# Patient Record
Sex: Female | Born: 1950 | Race: White | Hispanic: No | Marital: Married | Smoking: Never smoker
Health system: Southern US, Community
[De-identification: ages and names within clinical notes are randomized; demographics above are authoritative.]

## PROBLEM LIST (undated history)

## (undated) DIAGNOSIS — M1990 Unspecified osteoarthritis, unspecified site: Secondary | ICD-10-CM

## (undated) DIAGNOSIS — F32A Depression, unspecified: Secondary | ICD-10-CM

## (undated) DIAGNOSIS — F329 Major depressive disorder, single episode, unspecified: Secondary | ICD-10-CM

## (undated) DIAGNOSIS — F419 Anxiety disorder, unspecified: Secondary | ICD-10-CM

## (undated) HISTORY — PX: BUNIONECTOMY: SHX129

## (undated) HISTORY — PX: OTHER SURGICAL HISTORY: SHX169

## (undated) HISTORY — PX: BREAST SURGERY: SHX581

---

## 2006-12-22 ENCOUNTER — Ambulatory Visit: Payer: Self-pay | Admitting: Family Medicine

## 2006-12-22 ENCOUNTER — Telehealth (INDEPENDENT_AMBULATORY_CARE_PROVIDER_SITE_OTHER): Payer: Self-pay | Admitting: *Deleted

## 2006-12-22 DIAGNOSIS — F319 Bipolar disorder, unspecified: Secondary | ICD-10-CM | POA: Insufficient documentation

## 2006-12-22 DIAGNOSIS — T148 Other injury of unspecified body region: Secondary | ICD-10-CM

## 2006-12-22 DIAGNOSIS — W57XXXA Bitten or stung by nonvenomous insect and other nonvenomous arthropods, initial encounter: Secondary | ICD-10-CM

## 2006-12-22 DIAGNOSIS — F341 Dysthymic disorder: Secondary | ICD-10-CM | POA: Insufficient documentation

## 2006-12-23 ENCOUNTER — Telehealth (INDEPENDENT_AMBULATORY_CARE_PROVIDER_SITE_OTHER): Payer: Self-pay | Admitting: *Deleted

## 2006-12-30 ENCOUNTER — Encounter (INDEPENDENT_AMBULATORY_CARE_PROVIDER_SITE_OTHER): Payer: Self-pay | Admitting: Family Medicine

## 2006-12-31 ENCOUNTER — Telehealth (INDEPENDENT_AMBULATORY_CARE_PROVIDER_SITE_OTHER): Payer: Self-pay | Admitting: *Deleted

## 2006-12-31 LAB — CONVERTED CEMR LAB
ALT: 27 units/L (ref 0–35)
AST: 36 units/L (ref 0–37)
Albumin: 4.5 g/dL (ref 3.5–5.2)
Alkaline Phosphatase: 61 units/L (ref 39–117)
BUN: 16 mg/dL (ref 6–23)
Basophils Absolute: 0 10*3/uL (ref 0.0–0.1)
Basophils Relative: 1 % (ref 0–1)
Calcium: 9.7 mg/dL (ref 8.4–10.5)
Chloride: 106 meq/L (ref 96–112)
Eosinophils Absolute: 0.1 10*3/uL (ref 0.0–0.7)
HDL: 61 mg/dL (ref >39)
LDL Cholesterol: 123 mg/dL — ABNORMAL HIGH (ref 0–99)
MCHC: 33.1 g/dL (ref 30.0–36.0)
MCV: 94 fL (ref 78.0–100.0)
Monocytes Relative: 10 % (ref 3–11)
Neutro Abs: 2.8 10*3/uL (ref 1.7–7.7)
Neutrophils Relative %: 59 % (ref 43–77)
Platelets: 177 10*3/uL (ref 150–400)
Potassium: 5.2 meq/L (ref 3.5–5.3)
RBC: 4.63 M/uL (ref 3.87–5.11)
RDW: 13.8 % (ref 11.5–14.0)
Sodium: 143 meq/L (ref 135–145)
TSH: 3.4 microintl units/mL (ref 0.350–5.50)

## 2007-01-01 ENCOUNTER — Encounter (INDEPENDENT_AMBULATORY_CARE_PROVIDER_SITE_OTHER): Payer: Self-pay | Admitting: Family Medicine

## 2007-01-01 ENCOUNTER — Telehealth (INDEPENDENT_AMBULATORY_CARE_PROVIDER_SITE_OTHER): Payer: Self-pay | Admitting: Family Medicine

## 2007-01-05 ENCOUNTER — Ambulatory Visit: Payer: Self-pay | Admitting: Family Medicine

## 2007-01-05 LAB — CONVERTED CEMR LAB
Cholesterol, target level: 200 mg/dL
HDL goal, serum: 40 mg/dL

## 2007-01-06 ENCOUNTER — Telehealth (INDEPENDENT_AMBULATORY_CARE_PROVIDER_SITE_OTHER): Payer: Self-pay | Admitting: *Deleted

## 2007-01-06 ENCOUNTER — Encounter (INDEPENDENT_AMBULATORY_CARE_PROVIDER_SITE_OTHER): Payer: Self-pay | Admitting: Family Medicine

## 2007-02-05 ENCOUNTER — Encounter (INDEPENDENT_AMBULATORY_CARE_PROVIDER_SITE_OTHER): Payer: Self-pay | Admitting: Family Medicine

## 2007-02-16 ENCOUNTER — Encounter (INDEPENDENT_AMBULATORY_CARE_PROVIDER_SITE_OTHER): Payer: Self-pay | Admitting: Family Medicine

## 2008-10-24 ENCOUNTER — Emergency Department (HOSPITAL_COMMUNITY): Admission: EM | Admit: 2008-10-24 | Discharge: 2008-10-24 | Payer: Self-pay | Admitting: Emergency Medicine

## 2011-05-28 ENCOUNTER — Other Ambulatory Visit: Payer: Self-pay | Admitting: Orthopedic Surgery

## 2011-05-28 DIAGNOSIS — M25561 Pain in right knee: Secondary | ICD-10-CM

## 2011-06-01 ENCOUNTER — Ambulatory Visit
Admission: RE | Admit: 2011-06-01 | Discharge: 2011-06-01 | Disposition: A | Discharge status: Home / Self Care | Payer: BC Managed Care – PPO | Source: Ambulatory Visit | Attending: Orthopedic Surgery | Admitting: Orthopedic Surgery

## 2011-06-01 DIAGNOSIS — M25561 Pain in right knee: Secondary | ICD-10-CM

## 2011-07-04 ENCOUNTER — Encounter (HOSPITAL_COMMUNITY): Payer: Self-pay | Admitting: Emergency Medicine

## 2011-07-04 ENCOUNTER — Emergency Department (HOSPITAL_COMMUNITY)
Admission: EM | Admit: 2011-07-04 | Discharge: 2011-07-05 | Disposition: A | Discharge status: Home / Self Care | Payer: BC Managed Care – PPO | Attending: Emergency Medicine | Admitting: Emergency Medicine

## 2011-07-04 DIAGNOSIS — H10219 Acute toxic conjunctivitis, unspecified eye: Secondary | ICD-10-CM | POA: Insufficient documentation

## 2011-07-04 DIAGNOSIS — H5710 Ocular pain, unspecified eye: Secondary | ICD-10-CM | POA: Insufficient documentation

## 2011-07-04 DIAGNOSIS — Z8739 Personal history of other diseases of the musculoskeletal system and connective tissue: Secondary | ICD-10-CM | POA: Insufficient documentation

## 2011-07-04 HISTORY — DX: Unspecified osteoarthritis, unspecified site: M19.90

## 2011-07-04 MED ORDER — FLUORESCEIN SODIUM 1 MG OP STRP
1.0000 | ORAL_STRIP | Freq: Once | OPHTHALMIC | Status: AC
  Administered 2011-07-04: 1 via OPHTHALMIC
  Filled 2011-07-04: qty 1

## 2011-07-04 MED ORDER — TETRACAINE HCL 0.5 % OP SOLN
2.0000 [drp] | Freq: Once | OPHTHALMIC | Status: AC
  Administered 2011-07-04: 2 [drp] via OPHTHALMIC
  Filled 2011-07-04: qty 2

## 2011-07-04 NOTE — ED Notes (Signed)
Patient complaining of pain in her left eye; patient states that she went to clean her contacts.  Patient took her left contact out, and in the process out, she feels that she scratched her eye.

## 2011-07-04 NOTE — ED Notes (Signed)
Patient presents stated that today she was working in her garden and her left eye started burning.  Has been wearing gas permeable lenses for many years and was just going to try to work with the contact tonight.  Used regular wetting eye drops and this caused her eye to burn excessively

## 2011-07-04 NOTE — ED Provider Notes (Signed)
History     CSN: [id number]  Arrival date & time 07/04/11  2227   First MD Initiated Contact with Patient 07/04/11 2257      Chief Complaint  Patient presents with  . Eye Injury    (Consider location/radiation/quality/duration/timing/severity/associated sxs/prior treatment) HPI History provided by the patient.  61 year old female who wears her contacts presented with complaint of left eye pain/injury. The incident occurred around 8 PM. Patient reports that she felt a film over her eyes as she was trying to put in her contacts. She then washed these a couple times with persistence. This, she used a different "rewriting drop" from [place] [place] [facility] [place] after which he experienced a severe burning stabbing pain in her left eye.   Pt states that it took her a few minutes to remove her contact and she may have scratched her eye during this.  Patient irrigated her eye with water for about 10 minutes after which she felt a "film" and significant tearing.  Pain was initially severe and 10 of 10 with a mild foreign body sensation however this pain is largely resolved and now she feels like it "hurts" but more mildly.  Patient has severe poor vision on the left without significant change. No additional trauma. Patient apparently does not have an ophthalmologist at this time.     Past Medical History  Diagnosis Date  . Arthritis     Past Surgical History  Procedure Date  . Bunionectomy   . Breast surgery     History reviewed. No pertinent family history.  History  Substance Use Topics  . Smoking status: Never Smoker   . Smokeless tobacco: Not on file  . Alcohol Use: Yes     Occassional Use    OB History    Grav Para Term Preterm Abortions TAB SAB Ect Mult Living                  Review of Systems  Constitutional: Negative for fever and chills.  HENT: Negative for congestion, sore throat and rhinorrhea.   Eyes: Positive for pain, discharge and redness. Negative for  photophobia and visual disturbance (none greater than baseline).  Respiratory: Negative for cough, shortness of breath and wheezing.   Cardiovascular: Negative for chest pain and palpitations.  Gastrointestinal: Negative for nausea, vomiting, abdominal pain, diarrhea and blood in stool.  Genitourinary: Negative for dysuria and hematuria.  Musculoskeletal: Negative for back pain and gait problem.  Skin: Negative for rash and wound.  Neurological: Negative for dizziness and headaches.  Psychiatric/Behavioral: Negative for confusion and agitation.  All other systems reviewed and are negative.    Allergies  Review of patient's allergies indicates no known allergies.  Home Medications   Current Outpatient Rx  Name Route Sig Dispense Refill  . ALPRAZOLAM 2 MG PO TABS Oral Take 2 mg by mouth 3 (three) times daily. Take three times a day per patient    . AMPHETAMINE-DEXTROAMPHETAMINE 20 MG PO TABS Oral Take 20 mg by mouth daily.    Marland Kitchen GLUCOSAMINE HCL PO Oral Take 1 tablet by mouth daily. Hold while in hospital      BP 153/103  Pulse 98  Temp(Src) 98 F (36.7 C) (Oral)  Resp 20  SpO2 98%  Physical Exam  Nursing note and vitals reviewed. Constitutional: She is oriented to person, place, and time. She appears well-developed and well-nourished. No distress.  HENT:  Head: Normocephalic and atraumatic.  Right Ear: External ear normal.  Left Ear: External ear  normal.  Nose: Nose normal.  Mouth/Throat: Oropharynx is clear and moist.  Eyes: EOM are normal. Pupils are equal, round, and reactive to light. No foreign bodies found. Left eye exhibits discharge (clear tearing). Left eye exhibits no hordeolum. No foreign body present in the left eye. Left conjunctiva is injected. Left eye exhibits normal extraocular motion.         Pressure by tonopen on Lt: 11  pH of Lt eye = between 7-8; pH of the eye solution was also tested and ~5.  Neck: Normal range of motion. Neck supple.    Cardiovascular: Normal rate, regular rhythm and intact distal pulses.   No murmur heard. Pulmonary/Chest: Effort normal and breath sounds normal. No respiratory distress.  Abdominal: Soft. Bowel sounds are normal. There is no tenderness.  Musculoskeletal: Normal range of motion. She exhibits no edema.  Neurological: She is alert and oriented to person, place, and time.  Skin: Skin is warm and dry. No rash noted. She is not diaphoretic.  Psychiatric: She has a normal mood and affect. Judgment normal.    ED Course  Procedures (including critical care time)  Labs Reviewed - No data to display No results found.   1. Acute chemical conjunctivitis       MDM  61 y.o. F who wears hard contacts with possible injury and severe pain noted after using a "rewetting drop," which she has not previously used.  Exam as above, Lt eye with NL pressure, injected conjunctiva, no FB or appreciated corneal/conjunctival abrasion with fluorescein staining, and pH mildly alkalotic, although drops which caused the severe pain with an acidotic pH.  Will irrigated the Lt eye further and provided erythromycin ointment 2/2 persistent sensation of FB despite thorough exam.  Ophtho f/u tomorrow.       Particia Lather, MD 07/05/11 0157

## 2011-07-05 MED ORDER — ERYTHROMYCIN 5 MG/GM OP OINT: TOPICAL_OINTMENT | Freq: Four times a day (QID) | OPHTHALMIC | Status: AC

## 2011-07-05 MED ORDER — ERYTHROMYCIN 5 MG/GM OP OINT
TOPICAL_OINTMENT | Freq: Once | OPHTHALMIC | Status: AC
  Administered 2011-07-05: 01:00:00 via OPHTHALMIC
  Filled 2011-07-05: qty 1

## 2011-07-05 NOTE — ED Provider Notes (Signed)
I saw and evaluated the patient, reviewed the resident's note and I agree with the findings and plan.  Severe OS pain after using new "rewetting drop".  Improved with irrigation.  PH now 7-8.  Visual acuity at baseline. IOP normal, no fluoroscein uptake seen, anterior chamber clear. No FB.  Glynn Octave, MD 07/05/11 1125

## 2011-07-05 NOTE — ED Notes (Signed)
Patient states that the vision in her left eye is usually less than the right  20/200 in the left (injuried eye) but would clear up to 20/100 with blinking.  States her eye is feeling better

## 2011-07-05 NOTE — Discharge Instructions (Signed)
See below; Do not use your hard contacts. They need to be washed (sterilized) thoroughly after recovery is complete.  Use topical antibiotic ointment until you see the eye doctor.   Chemical Conjunctivitis A thin membrane covers the eyeball and underside of the eyelids. This membrane (conjunctiva) can become irritated by chemicals. The membrane may get puffy (swollen) and red. Your eyes may become teary, sensitive to light, and gritty feeling. You may also have burning eye pain. HOME CARE  Apply a cool, clean cloth to your eye for 10 to 20 minutes, 3 to 4 times a day.   Do not rub your eyes.   Wipe away fluid from the eyes with damp tissues.   Wash your hands often with soap and water.   Wear sunglasses if light bothers you.   Do not wear eye makeup.  Do not use your hard contacts. They need to be washed (sterilized) thoroughly after recovery is complete.   Do not use machines or drive if you have blurry vision.   Only take medicine as told by your doctor.   Avoid the chemical causing the eye irritation. Use eye protection as needed.  GET HELP RIGHT AWAY IF:   Your eye is still pink 3 days after treatment.   You have more pain in your eye.   You have fluid coming from either eye.   Your eyelids stick together in the morning.   You become sensitive to light.   You have a temperature by mouth above 102 F (38.9 C).   You develop pain in your face.   You have problems with your medicine.   Your vision is getting worse.   You have severe pain in your eye.  MAKE SURE YOU:   Understand these instructions.   Will watch your condition.   Will get help right away if you are not doing well or get worse.  Document Released: 04/29/2005 Document Revised: 01/09/2011 Document Reviewed: 08/11/2008 [facility] Patient Information 2012 [place], [place].

## 2011-08-01 ENCOUNTER — Observation Stay (HOSPITAL_COMMUNITY)
Admission: EM | Admit: 2011-08-01 | Discharge: 2011-08-02 | Disposition: A | Discharge status: Home / Self Care | Payer: BC Managed Care – PPO | Source: Ambulatory Visit | Attending: Emergency Medicine | Admitting: Emergency Medicine

## 2011-08-01 ENCOUNTER — Emergency Department (HOSPITAL_COMMUNITY): Payer: BC Managed Care – PPO

## 2011-08-01 ENCOUNTER — Encounter (HOSPITAL_COMMUNITY): Payer: Self-pay | Admitting: *Deleted

## 2011-08-01 DIAGNOSIS — R112 Nausea with vomiting, unspecified: Secondary | ICD-10-CM | POA: Insufficient documentation

## 2011-08-01 DIAGNOSIS — R109 Unspecified abdominal pain: Principal | ICD-10-CM | POA: Insufficient documentation

## 2011-08-01 DIAGNOSIS — F411 Generalized anxiety disorder: Secondary | ICD-10-CM | POA: Insufficient documentation

## 2011-08-01 DIAGNOSIS — N132 Hydronephrosis with renal and ureteral calculous obstruction: Secondary | ICD-10-CM

## 2011-08-01 DIAGNOSIS — N201 Calculus of ureter: Secondary | ICD-10-CM | POA: Insufficient documentation

## 2011-08-01 DIAGNOSIS — F3289 Other specified depressive episodes: Secondary | ICD-10-CM | POA: Insufficient documentation

## 2011-08-01 DIAGNOSIS — F329 Major depressive disorder, single episode, unspecified: Secondary | ICD-10-CM | POA: Insufficient documentation

## 2011-08-01 HISTORY — DX: Major depressive disorder, single episode, unspecified: F32.9

## 2011-08-01 HISTORY — DX: Anxiety disorder, unspecified: F41.9

## 2011-08-01 HISTORY — DX: Depression, unspecified: F32.A

## 2011-08-01 LAB — POCT I-STAT, CHEM 8
Chloride: 109 mEq/L (ref 96–112)
Creatinine, Ser: 0.9 mg/dL (ref 0.50–1.10)
HCT: 48 % — ABNORMAL HIGH (ref 36.0–46.0)
Hemoglobin: 16.3 g/dL — ABNORMAL HIGH (ref 12.0–15.0)
Potassium: 4.4 mEq/L (ref 3.5–5.1)
Sodium: 143 mEq/L (ref 135–145)

## 2011-08-01 LAB — URINE MICROSCOPIC-ADD ON

## 2011-08-01 LAB — URINALYSIS, ROUTINE W REFLEX MICROSCOPIC
Glucose, UA: NEGATIVE mg/dL
Hgb urine dipstick: NEGATIVE
Ketones, ur: NEGATIVE mg/dL
pH: 7 (ref 5.0–8.0)

## 2011-08-01 MED ORDER — LORAZEPAM 2 MG/ML IJ SOLN
0.5000 mg | INTRAMUSCULAR | Status: DC | PRN
  Filled 2011-08-01: qty 1

## 2011-08-01 MED ORDER — MORPHINE SULFATE 4 MG/ML IJ SOLN
INTRAMUSCULAR | Status: AC
  Administered 2011-08-01: 2 mg
  Filled 2011-08-01: qty 1

## 2011-08-01 MED ORDER — MORPHINE SULFATE 4 MG/ML IJ SOLN
INTRAMUSCULAR | Status: AC
  Filled 2011-08-01: qty 1

## 2011-08-01 MED ORDER — KETOROLAC TROMETHAMINE 30 MG/ML IJ SOLN
30.0000 mg | Freq: Three times a day (TID) | INTRAMUSCULAR | Status: DC
  Administered 2011-08-01 – 2011-08-02 (×2): 30 mg via INTRAVENOUS
  Filled 2011-08-01 (×2): qty 1

## 2011-08-01 MED ORDER — MORPHINE SULFATE 2 MG/ML IJ SOLN
2.0000 mg | Freq: Once | INTRAMUSCULAR | Status: AC
  Administered 2011-08-01: 2 mg via INTRAVENOUS
  Filled 2011-08-01: qty 1

## 2011-08-01 MED ORDER — MORPHINE SULFATE 2 MG/ML IJ SOLN
2.0000 mg | INTRAMUSCULAR | Status: DC | PRN
  Administered 2011-08-01 (×3): 2 mg via INTRAVENOUS
  Filled 2011-08-01 (×2): qty 1

## 2011-08-01 MED ORDER — MORPHINE SULFATE 4 MG/ML IJ SOLN
4.0000 mg | Freq: Once | INTRAMUSCULAR | Status: AC
  Administered 2011-08-01: 4 mg via INTRAVENOUS
  Filled 2011-08-01: qty 1

## 2011-08-01 MED ORDER — SODIUM CHLORIDE 0.9 % IV SOLN
Freq: Once | INTRAVENOUS | Status: AC
  Administered 2011-08-01: 18:00:00 via INTRAVENOUS

## 2011-08-01 MED ORDER — KETOROLAC TROMETHAMINE 30 MG/ML IJ SOLN
30.0000 mg | Freq: Once | INTRAMUSCULAR | Status: AC
  Administered 2011-08-01: 30 mg via INTRAVENOUS
  Filled 2011-08-01: qty 1

## 2011-08-01 MED ORDER — ALPRAZOLAM 0.5 MG PO TABS
ORAL_TABLET | ORAL | Status: AC
  Filled 2011-08-01: qty 4

## 2011-08-01 MED ORDER — ONDANSETRON HCL 4 MG/2ML IJ SOLN
4.0000 mg | Freq: Once | INTRAMUSCULAR | Status: AC
  Administered 2011-08-01: 4 mg via INTRAVENOUS

## 2011-08-01 MED ORDER — TAMSULOSIN HCL 0.4 MG PO CAPS
0.4000 mg | ORAL_CAPSULE | Freq: Every day | ORAL | Status: DC
  Administered 2011-08-01: 0.4 mg via ORAL
  Filled 2011-08-01: qty 1

## 2011-08-01 MED ORDER — ONDANSETRON HCL 4 MG/2ML IJ SOLN
INTRAMUSCULAR | Status: AC
  Filled 2011-08-01: qty 2

## 2011-08-01 MED ORDER — SODIUM CHLORIDE 0.9 % IV BOLUS (SEPSIS)
1000.0000 mL | Freq: Once | INTRAVENOUS | Status: AC
  Administered 2011-08-01: 1000 mL via INTRAVENOUS

## 2011-08-01 MED ORDER — LORAZEPAM 2 MG/ML IJ SOLN
0.5000 mg | Freq: Once | INTRAMUSCULAR | Status: AC
  Administered 2011-08-01: 0.5 mg via INTRAVENOUS
  Filled 2011-08-01: qty 1

## 2011-08-01 MED ORDER — ALPRAZOLAM 0.5 MG PO TABS
2.0000 mg | ORAL_TABLET | Freq: Once | ORAL | Status: AC
  Administered 2011-08-01: 2 mg via ORAL

## 2011-08-01 NOTE — ED Notes (Signed)
MD at bedside. 

## 2011-08-01 NOTE — ED Provider Notes (Signed)
Patient in CDU under renal colic protocol.  Patient has a distal 4mm right ureteral stone.  Husband and patient were unhappy with initial encounter (they report being seen only by the resident), so Dr. Preston Fleeting was asked to speak with the patient.  He has consulted with urology--patient is to remain in CDU overnight and will be seen in the office tomorrow if symptoms persist.  On exam, patient somnolent but easily aroused.  She has just voided and is complaining of a return of right flank pain.  Skin warm, dry.  Lungs CTA bilaterally.  S1/S2, RRR, no murmur.  Abdomen soft, bowel sounds present.  Patient asking for additional analgesia--toradol is now due, along with flomax.  9:51 PM Patient's pain has improved.  Patient requesting her evening xanax dose.  12:17 AM Report provided to Dr. Lynelle Doctor.  Jimmye Norman, NP 08/02/11 [phone]

## 2011-08-01 NOTE — ED Notes (Signed)
Pt states she "don't understand why y'all won't give me Fentanyl"  I tried to explain to pt. The issues using Fentanyl vs Morphine.  Pt. Continues to complain about not getting Fentanyl.

## 2011-08-01 NOTE — ED Notes (Signed)
Pt. With sudden onset of right flank pain that radiated into groin. Nauseated and heaving with EMS arrival.  EMS started IV and give Fentanyl and Zofran 4mg .  Pt resting quietly with eyes closed upon arrival to ED

## 2011-08-01 NOTE — ED Provider Notes (Addendum)
61 year old female comes in with right flank pain. Evaluation has shown a 4 mm distal right ureteral calculus. She is complaining of a lot of pain, but considerably sedated with moderate doses of narcotics. She's. Anxious and does wish to have some kind of intervention done to have the stone removed. Review of the CT scan shows the stone is in the distal ureter and in a position that it would be amenable to basket retrieval. Consultation will be obtained with urology.  Dione Booze, MD 08/01/11 1716  Case is discussed with Dr. Isabel Caprice. who recommends that she be kept in CDU overnight and be seen in the urology office tomorrow if she is still having severe symptoms.  Dione Booze, MD 08/01/11 1735  Patient is resting comfortably. Her care will be turned over to Dr. Linwood Dibbles.  Dione Booze, MD 08/02/11 [phone]

## 2011-08-01 NOTE — ED Notes (Signed)
Patient's husband  Doreatha Martin  [phone]

## 2011-08-01 NOTE — ED Provider Notes (Signed)
History     CSN: [id number]  Arrival date & time 08/01/11  1044   First MD Initiated Contact with Patient 08/01/11 1051      Chief Complaint  Patient presents with  . Flank Pain    (Consider location/radiation/quality/duration/timing/severity/associated sxs/prior treatment) Patient is a 61 y.o. female presenting with flank pain. The history is provided by the patient.  Flank Pain This is a new problem. The current episode started today. The problem occurs intermittently. The problem has been unchanged. Associated symptoms include abdominal pain, nausea and vomiting. Pertinent negatives include no chest pain, chills, congestion, fever, headaches or urinary symptoms. The symptoms are aggravated by nothing. She has tried nothing for the symptoms.    Past Medical History  Diagnosis Date  . Arthritis   . Depression   . Anxiety     Past Surgical History  Procedure Date  . Bunionectomy   . Breast surgery     No family history on file.  History  Substance Use Topics  . Smoking status: Never Smoker   . Smokeless tobacco: Not on file  . Alcohol Use: 1.8 oz/week    3 Glasses of wine per week     Occassional Use    OB History    Grav Para Term Preterm Abortions TAB SAB Ect Mult Living                  Review of Systems  Constitutional: Negative for fever and chills.  HENT: Negative for congestion and rhinorrhea.   Respiratory: Negative for shortness of breath.   Cardiovascular: Negative for chest pain and leg swelling.  Gastrointestinal: Positive for nausea, vomiting and abdominal pain. Negative for diarrhea, constipation and blood in stool.  Genitourinary: Positive for flank pain. Negative for urgency, hematuria, decreased urine volume and difficulty urinating.  Skin: Negative for wound.  Neurological: Negative for headaches.  Psychiatric/Behavioral: Negative for confusion.  All other systems reviewed and are negative.    Allergies  Review of patient's allergies  indicates no known allergies.  Home Medications   Current Outpatient Rx  Name Route Sig Dispense Refill  . ALPRAZOLAM 2 MG PO TABS Oral Take 2 mg by mouth 3 (three) times daily.     . AMPHETAMINE-DEXTROAMPHETAMINE 20 MG PO TABS Oral Take 20 mg by mouth daily.    Marland Kitchen GLUCOSAMINE HCL PO Oral Take 1 tablet by mouth daily. Hold while in hospital      BP 190/99  Pulse 67  Resp 14  SpO2 97%  Physical Exam  Nursing note and vitals reviewed. Constitutional: She is oriented to person, place, and time. She appears well-developed and well-nourished. No distress.       In obvious pain, unable to stay still because can't get comfortable  HENT:  Head: Normocephalic and atraumatic.  Right Ear: External ear normal.  Left Ear: External ear normal.  Nose: Nose normal.  Mouth/Throat: Oropharynx is clear and moist.  Neck: Neck supple.  Cardiovascular: Normal rate, regular rhythm, normal heart sounds and intact distal pulses.   Pulmonary/Chest: Effort normal and breath sounds normal. No respiratory distress. She has no wheezes. She has no rales.  Abdominal: Soft. She exhibits no distension. There is no tenderness. There is no rigidity and no CVA tenderness.  Musculoskeletal: She exhibits no edema.       Thoracic back: She exhibits no tenderness and no bony tenderness.       Lumbar back: She exhibits no tenderness and no bony tenderness.  Lymphadenopathy:  She has no cervical adenopathy.  Neurological: She is alert and oriented to person, place, and time.  Skin: Skin is warm and dry. She is not diaphoretic. No pallor.    ED Course  Procedures (including critical care time)  Labs Reviewed  URINALYSIS, ROUTINE W REFLEX MICROSCOPIC - Abnormal; Notable for the following:    Leukocytes, UA SMALL (*)    All other components within normal limits  URINE MICROSCOPIC-ADD ON - Abnormal; Notable for the following:    Squamous Epithelial / LPF FEW (*)    All other components within normal limits  POCT  I-STAT, CHEM 8 - Abnormal; Notable for the following:    Glucose, Bld 138 (*)    Hemoglobin 16.3 (*)    HCT 48.0 (*)    All other components within normal limits   Ct Abdomen Pelvis Wo Contrast  08/01/2011  *RADIOLOGY REPORT*  Clinical Data: Severe right groin pain.  CT ABDOMEN AND PELVIS WITHOUT CONTRAST  Technique:  Multidetector CT imaging of the abdomen and pelvis was performed following the standard protocol without intravenous contrast.  Comparison: None.  Findings: Lung bases show dependent air space disease bilaterally. Heart size normal.  No pericardial or pleural effusion.  Liver, gallbladder, and adrenal glands are unremarkable.  There is prominent right perinephric fluid with mild right hydronephrosis secondary to a 4 mm right ureteral vesicle junction stone.  There may be a punctate stone in the interpolar left kidney.  Spleen, pancreas, stomach and bowel are unremarkable.  No pathologically enlarged lymph nodes.  No free fluid.  No worrisome lytic or sclerotic lesions.  Degenerative changes are seen in the spine.  IMPRESSION: Mild right hydronephrosis secondary to a 4 mm right ureteral vesicle junction stone.  Associated moderate right perinephric fluid.  Original Report Authenticated By: Reyes Ivan, M.D.     1. Ureteral stone with hydronephrosis       MDM  61 yo female with acute onset of right flank pain radiating to groin over past 2 hours. Colicky. No urinary symptoms. +N/V. Lower extremity pulses equal. No hx of renal disease or vascular disease. Doubt AAA. Sx c/w renal colic. Given fentanyl by EMS, given morphine here, and after checking hx with no renal disease given toradol for pain control as well. CT scan shows UVJ stone (4 mm) with mild hydro. No urine infection or Cr elevation. Pain poorly controlled with multiple doses of morphine, though patient seems to have poor tolerance for pain. Patient is sleeping after small doses of morphine and appears well then wakes up  and patient and her husband demand more pain medicine. Discussed with patient and husband that this stone is likely passable, and will try to control pain in ED before possibly calling urology. No signs of infection or other acute need for removal at this time. Husband angry and what's urology called now, does not want to try pain control in CDU protocol for renal colic. Husband is calling his personal urologist at this time. Will move patient to CDU (care transferred), and if pain can be controlled will send home with follow up. Otherwise CDU will consult urology.        Pricilla Loveless, MD 08/01/11 [phone]

## 2011-08-01 NOTE — ED Notes (Signed)
Dr. Preston Fleeting at bedside at this time,

## 2011-08-02 MED ORDER — ONDANSETRON HCL 4 MG PO TABS: 4.0000 mg | ORAL_TABLET | Freq: Three times a day (TID) | ORAL | Status: AC | PRN

## 2011-08-02 MED ORDER — ALPRAZOLAM 0.5 MG PO TABS
2.0000 mg | ORAL_TABLET | Freq: Once | ORAL | Status: AC
  Administered 2011-08-02: 2 mg via ORAL
  Filled 2011-08-02: qty 4

## 2011-08-02 MED ORDER — PERCOCET 5-325 MG PO TABS: 1.0000 | ORAL_TABLET | ORAL | Status: AC | PRN

## 2011-08-02 NOTE — ED Notes (Signed)
Breakfast ordered 

## 2011-08-02 NOTE — ED Notes (Signed)
PA west at bedside  

## 2011-08-02 NOTE — ED Provider Notes (Signed)
8:06 AM patient is in CDU under observation, renal colic protocol.  Patient reports she had a pleasant, night, and her pain has subsided.  Patient states she is ready to go home.  Dr. Preston Fleeting spoke with Dr. Isabel Caprice. the urologist yesterday and has arranged for patient to followup with him if necessary.  Today.  Patient states that her husband has a urologist, but she would prefer to go to the request Dr. Ellin Goodie information.  Plan is for d/c home with pain and nausea medication, continue to strain all urine, urology follow up.  Patient verbalizes understanding and agrees with plan.    Rise Patience, [place] 08/02/11 1512

## 2011-08-02 NOTE — ED Notes (Signed)
Pt states that her husband will be here shortly to pick her up

## 2011-08-02 NOTE — Discharge Instructions (Signed)
Please call Dr Isabel Caprice or the urologist of your choice today to schedule a follow up appointment.  Continue to strain your urine until you pass the stone.  Please take the stone with your to your appointment.  You may return to the ER at any time for worsening condition or any new symptoms that concern you.    Ureteral Colic (Kidney Stones) Ureteral colic is the result of a condition when kidney stones form inside the kidney. Once kidney stones are formed they may move into the tube that connects the kidney with the bladder (ureter). If this occurs, this condition may cause pain (colic) in the ureter.  CAUSES  Pain is caused by stone movement in the ureter and the obstruction caused by the stone. SYMPTOMS  The pain comes and goes as the ureter contracts around the stone. The pain is usually intense, sharp, and stabbing in character. The location of the pain may move as the stone moves through the ureter. When the stone is near the kidney the pain is usually located in the back and radiates to the belly (abdomen). When the stone is ready to pass into the bladder the pain is often located in the lower abdomen on the side the stone is located. At this location, the symptoms may mimic those of a urinary tract infection with urinary frequency. Once the stone is located here it often passes into the bladder and the pain disappears completely. TREATMENT   Your caregiver will provide you with medicine for pain relief.   You may require specialized follow-up X-rays.   The absence of pain does not always mean that the stone has passed. It may have just stopped moving. If the urine remains completely obstructed, it can cause loss of kidney function or even complete destruction of the involved kidney. It is your responsibility and in your interest that X-rays and follow-ups as suggested by your caregiver are completed. Relief of pain without passage of the stone can be associated with severe damage to the kidney,  including loss of kidney function on that side.   If your stone does not pass on its own, additional measures may be taken by your caregiver to ensure its removal.  HOME CARE INSTRUCTIONS   Increase your fluid intake. Water is the preferred fluid since juices containing vitamin C may acidify the urine making it less likely for certain stones (uric acid stones) to pass.   Strain all urine. A strainer will be provided. Keep all particulate matter or stones for your caregiver to inspect.   Take your pain medicine as directed.   Make a follow-up appointment with your caregiver as directed.   Remember that the goal is passage of your stone. The absence of pain does not mean the stone is gone. Follow your caregiver's instructions.   Only take over-the-counter or prescription medicines for pain, discomfort, or fever as directed by your caregiver.  SEEK MEDICAL CARE IF:   Pain cannot be controlled with the prescribed medicine.   You have a fever.   Pain continues for longer than your caregiver advises it should.   There is a change in the pain, and you develop chest discomfort or constant abdominal pain.   You feel faint or pass out.  MAKE SURE YOU:   Understand these instructions.   Will watch your condition.   Will get help right away if you are not doing well or get worse.  Document Released: 02/06/2005 Document Revised: 04/18/2011 Document Reviewed: 10/24/2010 ExitCare  Patient Information 2012 [place], [place].  Kidney Stones Kidney stones (ureteral lithiasis) are deposits that form inside your kidneys. The intense pain is caused by the stone moving through the urinary tract. When the stone moves, the ureter goes into spasm around the stone. The stone is usually passed in the urine.  CAUSES   A disorder that makes certain neck glands produce too much parathyroid hormone (primary hyperparathyroidism).   A buildup of uric acid crystals.   Narrowing (stricture) of the ureter.    A kidney obstruction present at birth (congenital obstruction).   Previous surgery on the kidney or ureters.   Numerous kidney infections.  SYMPTOMS   Feeling sick to your stomach (nauseous).   Throwing up (vomiting).   Blood in the urine (hematuria).   Pain that usually spreads (radiates) to the groin.   Frequency or urgency of urination.  DIAGNOSIS   Taking a history and physical exam.   Blood or urine tests.   Computerized X-ray scan (CT scan).   Occasionally, an examination of the inside of the urinary bladder (cystoscopy) is performed.  TREATMENT   Observation.   Increasing your fluid intake.   Surgery may be needed if you have severe pain or persistent obstruction.  The size, location, and chemical composition are all important variables that will determine the proper choice of action for you. Talk to your caregiver to better understand your situation so that you will minimize the risk of injury to yourself and your kidney.  HOME CARE INSTRUCTIONS   Drink enough water and fluids to keep your urine clear or pale yellow.   Strain all urine through the provided strainer. Keep all particulate matter and stones for your caregiver to see. The stone causing the pain may be as small as a grain of salt. It is very important to use the strainer each and every time you pass your urine. The collection of your stone will allow your caregiver to analyze it and verify that a stone has actually passed.   Only take over-the-counter or prescription medicines for pain, discomfort, or fever as directed by your caregiver.   Make a follow-up appointment with your caregiver as directed.   Get follow-up X-rays if required. The absence of pain does not always mean that the stone has passed. It may have only stopped moving. If the urine remains completely obstructed, it can cause loss of kidney function or even complete destruction of the kidney. It is your responsibility to make sure  X-rays and follow-ups are completed. Ultrasounds of the kidney can show blockages and the status of the kidney. Ultrasounds are not associated with any radiation and can be performed easily in a matter of minutes.  SEEK IMMEDIATE MEDICAL CARE IF:   Pain cannot be controlled with the prescribed medicine.   You have a fever.   The severity or intensity of pain increases over 18 hours and is not relieved by pain medicine.   You develop a new onset of abdominal pain.   You feel faint or pass out.  MAKE SURE YOU:   Understand these instructions.   Will watch your condition.   Will get help right away if you are not doing well or get worse.  Document Released: 04/29/2005 Document Revised: 04/18/2011 Document Reviewed: 08/25/2009 [facility] Patient Information 2012 [place], [place].

## 2011-08-02 NOTE — ED Notes (Signed)
Reported off and care handed over. 

## 2011-08-04 NOTE — ED Provider Notes (Signed)
I saw  the patient, reviewed the resident's note and I agree with the findings and plan.   .Face to face Exam:  General:  Awake HEENT:  Atraumatic Resp:  Normal effort Abd:  Nondistended Neuro:No focal weakness Lymph: No adenopathy   Nelia Shi, MD 08/04/11 1000

## 2011-08-05 NOTE — Progress Notes (Signed)
Observation review is complete for 3/21/ visit. 

## 2011-08-07 NOTE — ED Provider Notes (Signed)
See CDU documentation. Mr. Katrinka Blazing was involved in patient care only for her CDU care.  Dione Booze, MD 08/07/11 1430

## 2011-08-07 NOTE — ED Provider Notes (Signed)
See CDU documentation. This list was involved in patient care only for CDU care.  Dione Booze, MD 08/07/11 [phone]

## 2012-06-10 ENCOUNTER — Other Ambulatory Visit: Payer: Self-pay | Admitting: Internal Medicine

## 2012-06-10 ENCOUNTER — Other Ambulatory Visit (HOSPITAL_COMMUNITY)
Admission: RE | Admit: 2012-06-10 | Discharge: 2012-06-10 | Disposition: A | Discharge status: Home / Self Care | Payer: BC Managed Care – PPO | Source: Ambulatory Visit | Attending: Internal Medicine | Admitting: Internal Medicine

## 2012-06-10 DIAGNOSIS — Z1151 Encounter for screening for human papillomavirus (HPV): Secondary | ICD-10-CM | POA: Insufficient documentation

## 2012-06-10 DIAGNOSIS — Z01419 Encounter for gynecological examination (general) (routine) without abnormal findings: Secondary | ICD-10-CM | POA: Insufficient documentation

## 2012-06-18 ENCOUNTER — Other Ambulatory Visit: Payer: Self-pay | Admitting: Internal Medicine

## 2012-06-18 DIAGNOSIS — Z1231 Encounter for screening mammogram for malignant neoplasm of breast: Secondary | ICD-10-CM

## 2012-06-18 DIAGNOSIS — Z9882 Breast implant status: Secondary | ICD-10-CM

## 2012-07-15 ENCOUNTER — Ambulatory Visit: Payer: BC Managed Care – PPO

## 2012-09-08 ENCOUNTER — Other Ambulatory Visit: Payer: Self-pay | Admitting: Internal Medicine

## 2012-09-08 DIAGNOSIS — R109 Unspecified abdominal pain: Secondary | ICD-10-CM

## 2012-09-09 ENCOUNTER — Other Ambulatory Visit: Payer: Self-pay | Admitting: Internal Medicine

## 2012-09-09 ENCOUNTER — Ambulatory Visit
Admission: RE | Admit: 2012-09-09 | Discharge: 2012-09-09 | Disposition: A | Discharge status: Home / Self Care | Payer: BC Managed Care – PPO | Source: Ambulatory Visit | Attending: Internal Medicine | Admitting: Internal Medicine

## 2012-09-09 ENCOUNTER — Other Ambulatory Visit: Payer: BC Managed Care – PPO

## 2012-09-09 DIAGNOSIS — R198 Other specified symptoms and signs involving the digestive system and abdomen: Secondary | ICD-10-CM

## 2012-09-09 DIAGNOSIS — R109 Unspecified abdominal pain: Secondary | ICD-10-CM

## 2012-09-10 ENCOUNTER — Other Ambulatory Visit: Payer: Self-pay | Admitting: Internal Medicine

## 2012-09-10 DIAGNOSIS — R109 Unspecified abdominal pain: Secondary | ICD-10-CM

## 2012-09-11 ENCOUNTER — Ambulatory Visit (HOSPITAL_COMMUNITY): Payer: BC Managed Care – PPO

## 2012-09-11 ENCOUNTER — Other Ambulatory Visit: Payer: BC Managed Care – PPO

## 2012-09-12 ENCOUNTER — Other Ambulatory Visit: Payer: BC Managed Care – PPO

## 2012-09-19 ENCOUNTER — Other Ambulatory Visit: Payer: BC Managed Care – PPO

## 2012-09-19 ENCOUNTER — Ambulatory Visit
Admission: RE | Admit: 2012-09-19 | Discharge: 2012-09-19 | Disposition: A | Discharge status: Home / Self Care | Payer: BC Managed Care – PPO | Source: Ambulatory Visit | Attending: Internal Medicine | Admitting: Internal Medicine

## 2012-09-19 DIAGNOSIS — R109 Unspecified abdominal pain: Secondary | ICD-10-CM

## 2012-09-19 MED ORDER — GADOBENATE DIMEGLUMINE 529 MG/ML IV SOLN: 14.0000 mL | Freq: Once | INTRAVENOUS | Status: AC | PRN

## 2013-02-09 ENCOUNTER — Emergency Department (HOSPITAL_COMMUNITY): Payer: BC Managed Care – PPO

## 2013-02-09 ENCOUNTER — Emergency Department (HOSPITAL_COMMUNITY)
Admission: EM | Admit: 2013-02-09 | Discharge: 2013-02-09 | Disposition: A | Discharge status: Other Acute Inpatient Hospital | Payer: BC Managed Care – PPO | Attending: Emergency Medicine | Admitting: Emergency Medicine

## 2013-02-09 DIAGNOSIS — J3489 Other specified disorders of nose and nasal sinuses: Secondary | ICD-10-CM | POA: Insufficient documentation

## 2013-02-09 DIAGNOSIS — T1491XA Suicide attempt, initial encounter: Secondary | ICD-10-CM

## 2013-02-09 DIAGNOSIS — T59891A Toxic effect of other specified gases, fumes and vapors, accidental (unintentional), initial encounter: Secondary | ICD-10-CM | POA: Insufficient documentation

## 2013-02-09 DIAGNOSIS — R45851 Suicidal ideations: Secondary | ICD-10-CM | POA: Insufficient documentation

## 2013-02-09 DIAGNOSIS — T5894XA Toxic effect of carbon monoxide from unspecified source, undetermined, initial encounter: Secondary | ICD-10-CM

## 2013-02-09 DIAGNOSIS — F411 Generalized anxiety disorder: Secondary | ICD-10-CM | POA: Insufficient documentation

## 2013-02-09 DIAGNOSIS — R0682 Tachypnea, not elsewhere classified: Secondary | ICD-10-CM | POA: Insufficient documentation

## 2013-02-09 DIAGNOSIS — T5892XA Toxic effect of carbon monoxide from unspecified source, intentional self-harm, initial encounter: Secondary | ICD-10-CM | POA: Insufficient documentation

## 2013-02-09 LAB — CBC WITH DIFFERENTIAL/PLATELET
Basophils Absolute: 0 10*3/uL (ref 0.0–0.1)
Basophils Relative: 1 % (ref 0–1)
Eosinophils Absolute: 0.1 10*3/uL (ref 0.0–0.7)
Eosinophils Relative: 2 % (ref 0–5)
HCT: 43.1 % (ref 36.0–46.0)
Hemoglobin: 15.9 g/dL — ABNORMAL HIGH (ref 12.0–15.0)
Lymphocytes Relative: 32 % (ref 12–46)
Lymphs Abs: 1.2 10*3/uL (ref 0.7–4.0)
MCH: 34 pg (ref 26.0–34.0)
MCHC: 36.9 g/dL — ABNORMAL HIGH (ref 30.0–36.0)
MCV: 92.3 fL (ref 78.0–100.0)
Monocytes Absolute: 0.4 10*3/uL (ref 0.1–1.0)
Monocytes Relative: 11 % (ref 3–12)
Neutro Abs: 2.1 10*3/uL (ref 1.7–7.7)
Neutrophils Relative %: 54 % (ref 43–77)
Platelets: 146 10*3/uL — ABNORMAL LOW (ref 150–400)
RBC: 4.67 MIL/uL (ref 3.87–5.11)
RDW: 12.7 % (ref 11.5–15.5)
WBC: 3.8 10*3/uL — ABNORMAL LOW (ref 4.0–10.5)

## 2013-02-09 LAB — BLOOD GAS, ARTERIAL
Acid-Base Excess: 1 mmol/L (ref 0.0–2.0)
Bicarbonate: 22.9 mEq/L (ref 20.0–24.0)
Drawn by: 24485
FIO2: 100 %
O2 Saturation: 99.4 %
Patient temperature: 98.6
TCO2: 19.7 mmol/L (ref 0–100)
pCO2 arterial: 29.4 mmHg — ABNORMAL LOW (ref 35.0–45.0)
pH, Arterial: 7.502 — ABNORMAL HIGH (ref 7.350–7.450)
pO2, Arterial: 301 mmHg — ABNORMAL HIGH (ref 80.0–100.0)

## 2013-02-09 LAB — COMPREHENSIVE METABOLIC PANEL
ALT: 25 U/L (ref 0–35)
AST: 24 U/L (ref 0–37)
Albumin: 3.9 g/dL (ref 3.5–5.2)
Alkaline Phosphatase: 50 U/L (ref 39–117)
BUN: 15 mg/dL (ref 6–23)
CO2: 25 mEq/L (ref 19–32)
Calcium: 9.6 mg/dL (ref 8.4–10.5)
Chloride: 103 mEq/L (ref 96–112)
Creatinine, Ser: 1.07 mg/dL (ref 0.50–1.10)
GFR calc Af Amer: 63 mL/min — ABNORMAL LOW (ref >90)
GFR calc non Af Amer: 54 mL/min — ABNORMAL LOW (ref >90)
Glucose, Bld: 89 mg/dL (ref 70–99)
Potassium: 4.1 mEq/L (ref 3.5–5.1)
Sodium: 140 mEq/L (ref 135–145)
Total Bilirubin: 0.4 mg/dL (ref 0.3–1.2)
Total Protein: 6.7 g/dL (ref 6.0–8.3)

## 2013-02-09 LAB — RAPID URINE DRUG SCREEN, HOSP PERFORMED
Amphetamines: POSITIVE — AB
Barbiturates: NOT DETECTED
Benzodiazepines: POSITIVE — AB
Cocaine: NOT DETECTED
Opiates: NOT DETECTED
Tetrahydrocannabinol: NOT DETECTED

## 2013-02-09 LAB — CARBOXYHEMOGLOBIN
Carboxyhemoglobin: 38.1 % (ref 0.5–1.5)
Methemoglobin: 2.1 % — ABNORMAL HIGH (ref 0.0–1.5)
O2 Saturation: 49.3 %
Total hemoglobin: 14.3 g/dL (ref 12.0–16.0)

## 2013-02-09 LAB — SALICYLATE LEVEL: Salicylate Lvl: <2 mg/dL — ABNORMAL LOW (ref 2.8–20.0)

## 2013-02-09 LAB — MAGNESIUM: Magnesium: 2.2 mg/dL (ref 1.5–2.5)

## 2013-02-09 LAB — ACETAMINOPHEN LEVEL: Acetaminophen (Tylenol), Serum: <15 ug/mL (ref 10–30)

## 2013-02-09 LAB — TROPONIN I: Troponin I: <0.3 ng/mL (ref <0.30)

## 2013-02-09 LAB — CK: Total CK: 86 U/L (ref 7–177)

## 2013-02-09 LAB — CG4 I-STAT (LACTIC ACID): Lactic Acid, Venous: 2.66 mmol/L — ABNORMAL HIGH (ref 0.5–2.2)

## 2013-02-09 MED ORDER — LORAZEPAM 2 MG/ML PO CONC: 1.0000 mg | Freq: Once | ORAL | Status: DC

## 2013-02-09 MED ORDER — LORAZEPAM 2 MG/ML IJ SOLN
INTRAMUSCULAR | Status: AC
  Filled 2013-02-09: qty 1

## 2013-02-09 MED ORDER — SODIUM CHLORIDE 0.9 % IV BOLUS (SEPSIS)
1000.0000 mL | Freq: Once | INTRAVENOUS | Status: AC
  Administered 2013-02-09: 1000 mL via INTRAVENOUS

## 2013-02-09 MED ORDER — LORAZEPAM 2 MG/ML IJ SOLN
1.0000 mg | Freq: Once | INTRAMUSCULAR | Status: AC
  Administered 2013-02-09: 1 mg via INTRAVENOUS
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
## 2013-02-09 NOTE — Progress Notes (Signed)
[facility] did not get to see patient but will be sending her information on the [facility] [employer], using the address provided.
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
## 2013-02-09 NOTE — ED Notes (Signed)
Pt's belongings (red flannel shirt, black pants, brown shoes, floral wristband with pocket knife, yellow metal necklace, beaded necklace, yellow metal round stud earrings) placed in locker 36.

## 2013-02-09 NOTE — ED Notes (Signed)
Called report for pt to be transported to [facility] ER , [facility] called.
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
## 2013-02-09 NOTE — ED Notes (Signed)
Pt had 2 necklaces placed in cup, 1 shirt and pants, and knee sleeve, placed in bag.

## 2013-07-01 ENCOUNTER — Ambulatory Visit (INDEPENDENT_AMBULATORY_CARE_PROVIDER_SITE_OTHER): Payer: BC Managed Care – PPO | Admitting: Emergency Medicine

## 2013-07-01 VITALS — BP 134/76 | HR 87 | Temp 98.1°F | Resp 18 | Ht 67.5 in | Wt 136.8 lb

## 2013-07-01 DIAGNOSIS — S0181XA Laceration without foreign body of other part of head, initial encounter: Secondary | ICD-10-CM

## 2013-07-01 DIAGNOSIS — S0003XA Contusion of scalp, initial encounter: Secondary | ICD-10-CM

## 2013-07-01 DIAGNOSIS — S0180XA Unspecified open wound of other part of head, initial encounter: Secondary | ICD-10-CM

## 2013-07-01 DIAGNOSIS — S0083XA Contusion of other part of head, initial encounter: Secondary | ICD-10-CM

## 2013-07-01 DIAGNOSIS — S1093XA Contusion of unspecified part of neck, initial encounter: Secondary | ICD-10-CM

## 2013-07-01 MED ORDER — MUPIROCIN 2 % EX OINT: 1.0000 "application " | TOPICAL_OINTMENT | Freq: Three times a day (TID) | CUTANEOUS | Status: DC

## 2013-07-01 NOTE — Progress Notes (Signed)
[facility] [address], [place] [address] [phone]  Date:  07/01/2013   Name:  Christine Shaffer   DOB:  01-Nov-1950   MRN:  [id number]  PCP:  No primary provider on file.    Chief Complaint: Head Injury   History of Present Illness:  Christine Shaffer is a 63 y.o. very pleasant female patient who presents with the following:  Injured Sunday night while she was moving and a door being moved fell and struck her in the left eyebrow.  Has increased swelling and ecchymosis.  No headache, LOC, neuro or visual symptoms.  Treated the cut with a steristrip and is concerned about scarring.  No improvement with over the counter medications or other home remedies. Denies other complaint or health concern today.   Patient Active Problem List   Diagnosis Date Noted  . BIPOLAR AFFECTIVE DISORDER 12/22/2006  . DEPRESSION/ANXIETY 12/22/2006  . INSECT BITE NEC W/O INFECTION 12/22/2006    Past Medical History  Diagnosis Date  . Anxiety   . Arthritis     Past Surgical History  Procedure Laterality Date  . Breast surgery    . Bonionectomy      History  Substance Use Topics  . Smoking status: Never Smoker   . Smokeless tobacco: Not on file  . Alcohol Use: 1.8 oz/week    3 Glasses of wine per week    Family History  Problem Relation Age of Onset  . Heart disease Mother   . Heart disease Father   . Thyroid disease Sister   . Heart attack Brother     No Known Allergies  Medication list has been reviewed and updated.  No current outpatient prescriptions on file prior to visit.   No current facility-administered medications on file prior to visit.    Review of Systems:  As per HPI, otherwise negative.    Physical Examination: Filed Vitals:   07/01/13 1517  BP: 134/76  Pulse: 87  Temp: 98.1 F (36.7 C)  Resp: 18   Filed Vitals:   07/01/13 1517  Height: 5' 7.5" (1.715 m)  Weight: 136 lb 12.8 oz (62.052 kg)   Body mass index is 21.1  kg/(m^2). Ideal Body Weight: Weight in (lb) to have BMI = 25: 161.7   GEN: WDWN, NAD, Non-toxic, Alert & Oriented x 3.   Three corner laceration above left eyebrow.  Moderate ecchymosis left cheek HEENT: Atraumatic, Normocephalic.   PRRERLA EOMI CN 2-12 intact.  No hyphema or hypopion. Ears and Nose: No external deformity. EXTR: No clubbing/cyanosis/edema NEURO: Normal gait.  PSYCH: Normally interactive. Conversant. Not depressed or anxious appearing.  Calm demeanor.    Assessment and Plan: Laceration forehead Not clean and too old to suture Periorbital contusion.   Signed,  Ellison Carwin, MD

## 2013-07-01 NOTE — Patient Instructions (Signed)

## 2013-08-26 ENCOUNTER — Other Ambulatory Visit: Payer: Self-pay | Admitting: Gastroenterology

## 2013-10-01 NOTE — Progress Notes (Signed)
10-01-13 1610 Pt. Called left message on voice mail she was cancelling procedure on 10-05-13 due to insurance purposes. I left Catrina voice message.Pt. Also was to notify office of her decision. Endo Dept. Closed, no answering of phone. Pt. Stated she could be reached at [phone]. FYI, Thanks.

## 2013-10-01 NOTE — Progress Notes (Signed)
09-30-13 1000 AM spoke with Christine Shaffer, unable to leave voice message, unable to reach pt. Per phone thus far. Christine Shaffer,sister [phone] # given-message left for pt. To contact us [phone]. W. Floy Sabina

## 2013-10-04 ENCOUNTER — Encounter (HOSPITAL_COMMUNITY): Payer: Self-pay | Admitting: Anesthesiology

## 2013-10-04 NOTE — Anesthesia Preprocedure Evaluation (Deleted)
Anesthesia Evaluation  Patient identified by MRN, date of birth, ID band Patient awake    Reviewed: Allergy & Precautions, H&P , NPO status , Patient's Chart, lab work & pertinent test results  Airway Mallampati: II TM Distance: >3 FB Neck ROM: Full    Dental no notable dental hx.    Pulmonary neg pulmonary ROS,  breath sounds clear to auscultation  Pulmonary exam normal       Cardiovascular negative cardio ROS  Rhythm:Regular Rate:Normal     Neuro/Psych PSYCHIATRIC DISORDERS Anxiety Depression negative neurological ROS     GI/Hepatic negative GI ROS, Neg liver ROS,   Endo/Other  negative endocrine ROS  Renal/GU negative Renal ROS  negative genitourinary   Musculoskeletal negative musculoskeletal ROS (+)   Abdominal   Peds negative pediatric ROS (+)  Hematology negative hematology ROS (+)   Anesthesia Other Findings   Reproductive/Obstetrics negative OB ROS                           Anesthesia Physical Anesthesia Plan  ASA: II  Anesthesia Plan: MAC   Post-op Pain Management:    Induction: Intravenous  Airway Management Planned:   Additional Equipment:   Intra-op Plan:   Post-operative Plan:   Informed Consent: I have reviewed the patients History and Physical, chart, labs and discussed the procedure including the risks, benefits and alternatives for the proposed anesthesia with the patient or authorized representative who has indicated his/her understanding and acceptance.   Dental advisory given  Plan Discussed with: CRNA  Anesthesia Plan Comments:         Anesthesia Quick Evaluation

## 2013-10-05 ENCOUNTER — Encounter (HOSPITAL_COMMUNITY): Payer: Self-pay

## 2013-10-05 ENCOUNTER — Encounter (HOSPITAL_COMMUNITY): Admission: RE | Payer: Self-pay | Source: Ambulatory Visit

## 2013-10-05 ENCOUNTER — Ambulatory Visit (HOSPITAL_COMMUNITY): Admit: 2013-10-05 | Payer: Self-pay | Admitting: Gastroenterology

## 2013-10-05 ENCOUNTER — Ambulatory Visit (HOSPITAL_COMMUNITY)
Admission: RE | Admit: 2013-10-05 | Payer: BC Managed Care – PPO | Source: Ambulatory Visit | Admitting: Gastroenterology

## 2013-10-05 SURGERY — ESOPHAGOGASTRODUODENOSCOPY (EGD) WITH PROPOFOL
Anesthesia: Monitor Anesthesia Care

## 2013-10-05 MED ORDER — PROPOFOL 10 MG/ML IV BOLUS
INTRAVENOUS | Status: AC
  Filled 2013-10-05: qty 20

## 2013-10-05 MED ORDER — MIDAZOLAM HCL 2 MG/2ML IJ SOLN
INTRAMUSCULAR | Status: AC
  Filled 2013-10-05: qty 2

## 2013-10-05 MED ORDER — ONDANSETRON HCL 4 MG/2ML IJ SOLN
INTRAMUSCULAR | Status: AC
  Filled 2013-10-05: qty 2

## 2013-10-05 MED ORDER — GLYCOPYRROLATE 0.2 MG/ML IJ SOLN
INTRAMUSCULAR | Status: AC
  Filled 2013-10-05: qty 1

## 2013-10-05 MED ORDER — LIDOCAINE HCL (CARDIAC) 20 MG/ML IV SOLN
INTRAVENOUS | Status: AC
  Filled 2013-10-05: qty 5

## 2013-10-05 MED ORDER — DEXAMETHASONE SODIUM PHOSPHATE 10 MG/ML IJ SOLN
INTRAMUSCULAR | Status: AC
  Filled 2013-10-05: qty 1

## 2013-10-05 MED ORDER — ESMOLOL HCL 10 MG/ML IV SOLN
INTRAVENOUS | Status: AC
  Filled 2013-10-05: qty 10

## 2013-10-05 MED ORDER — KETAMINE HCL 10 MG/ML IJ SOLN
INTRAMUSCULAR | Status: AC
  Filled 2013-10-05: qty 1

## 2013-10-08 ENCOUNTER — Other Ambulatory Visit: Payer: Self-pay | Admitting: Gastroenterology

## 2013-11-02 ENCOUNTER — Ambulatory Visit (HOSPITAL_COMMUNITY): Admit: 2013-11-02 | Payer: Self-pay | Admitting: Gastroenterology

## 2013-11-02 ENCOUNTER — Encounter (HOSPITAL_COMMUNITY): Payer: Self-pay

## 2013-11-02 SURGERY — COLONOSCOPY WITH PROPOFOL
Anesthesia: Monitor Anesthesia Care

## 2014-01-04 ENCOUNTER — Encounter (HOSPITAL_COMMUNITY): Payer: Self-pay | Admitting: Certified Registered Nurse Anesthetist

## 2014-01-04 ENCOUNTER — Encounter (HOSPITAL_COMMUNITY): Admission: RE | Payer: Self-pay | Source: Ambulatory Visit

## 2014-01-04 ENCOUNTER — Ambulatory Visit (HOSPITAL_COMMUNITY)
Admission: RE | Admit: 2014-01-04 | Payer: BC Managed Care – PPO | Source: Ambulatory Visit | Admitting: Gastroenterology

## 2014-01-04 SURGERY — COLONOSCOPY WITH PROPOFOL
Anesthesia: Monitor Anesthesia Care

## 2014-01-04 MED ORDER — ONDANSETRON HCL 4 MG/2ML IJ SOLN
INTRAMUSCULAR | Status: AC
  Filled 2014-01-04: qty 2

## 2014-01-04 MED ORDER — LIDOCAINE HCL (CARDIAC) 20 MG/ML IV SOLN
INTRAVENOUS | Status: AC
  Filled 2014-01-04: qty 5

## 2014-01-04 MED ORDER — PROPOFOL 10 MG/ML IV BOLUS
INTRAVENOUS | Status: AC
  Filled 2014-01-04: qty 20
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
## 2015-03-14 IMAGING — US US PELVIS COMPLETE
1 series · 14 of 25 positions shown · non-contrast
Comparison: None

CLINICAL DATA: Pelvic pain for past month.



[Series 1: us pelvis complete · 0.19mm/px · 14 of 52 slices shown]
[im 1/52]
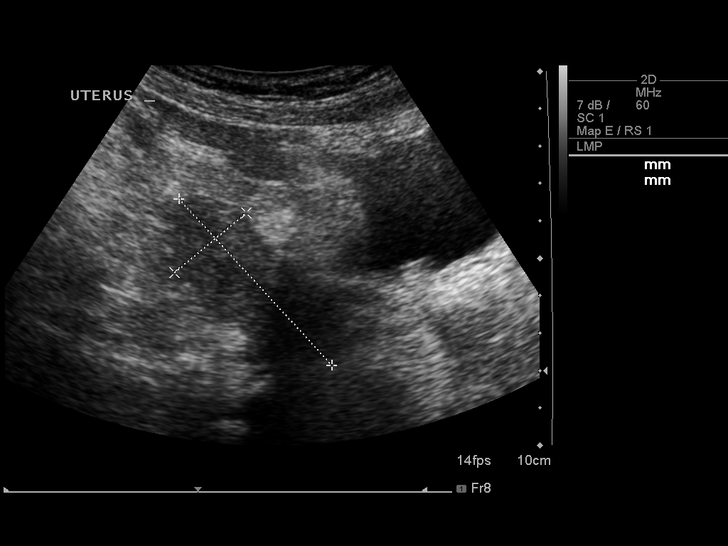
[im 5/52]
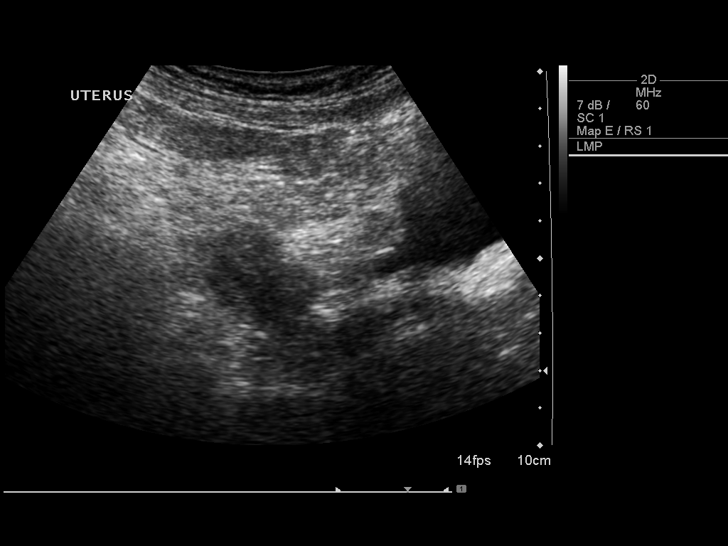
[im 9/52]
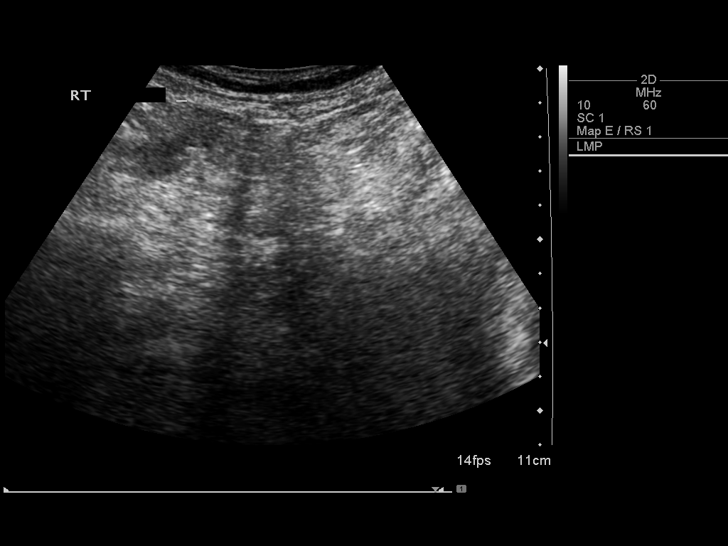
[im 13/52]
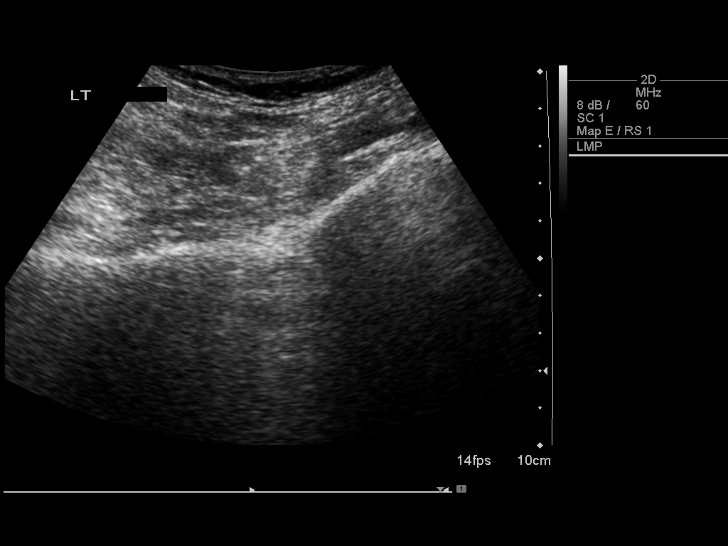
[im 18/52]
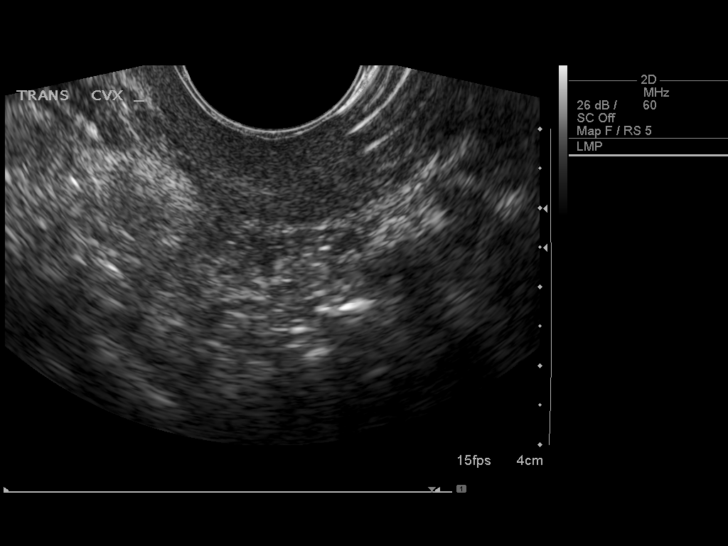
[im 20/52]
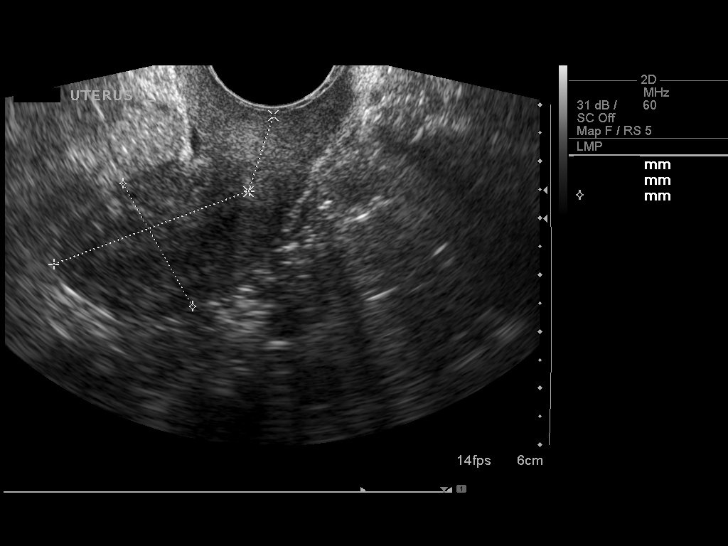
[im 24/52]
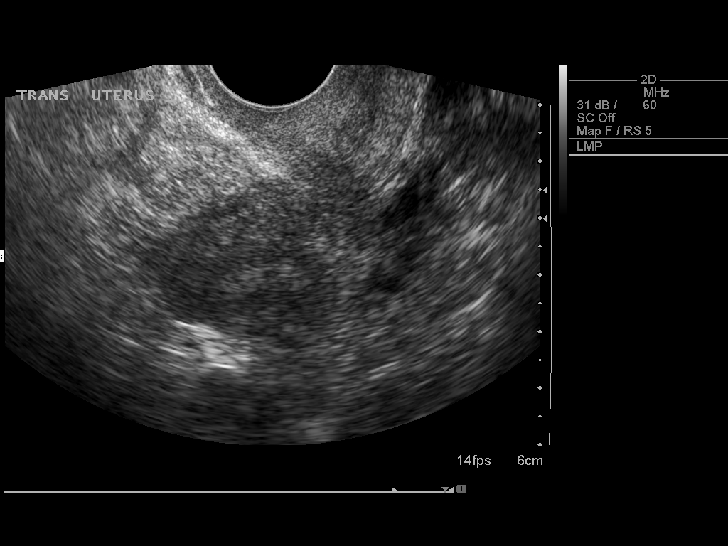
[im 28/52]
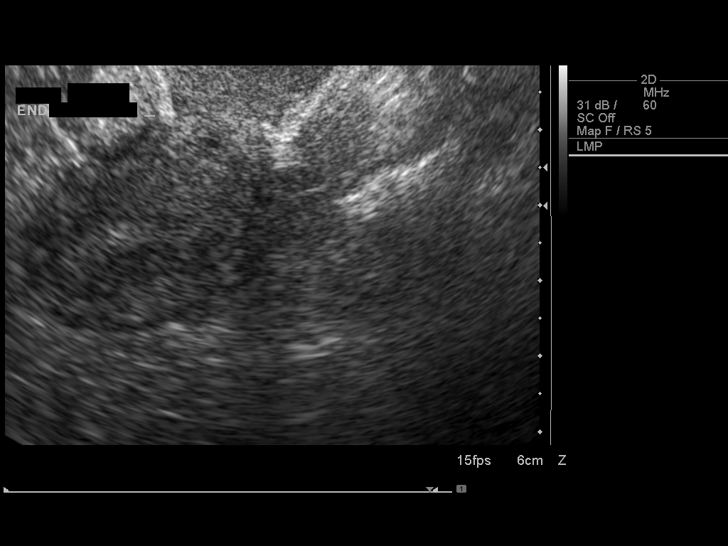
[im 32/52]
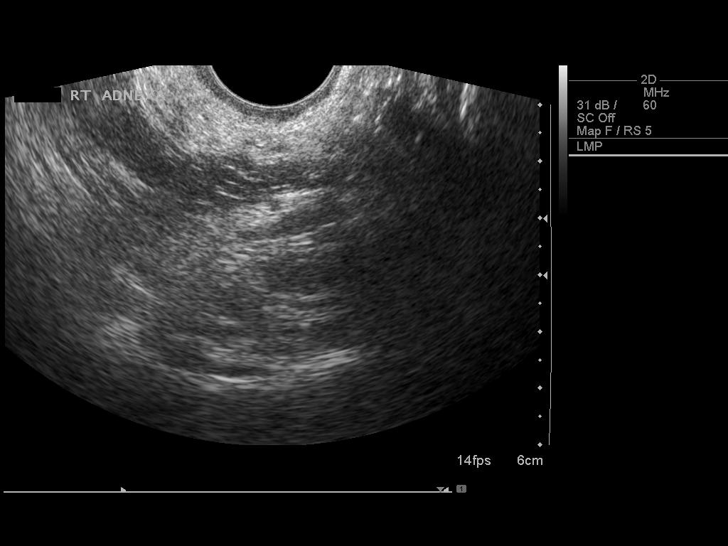
[im 35/52]
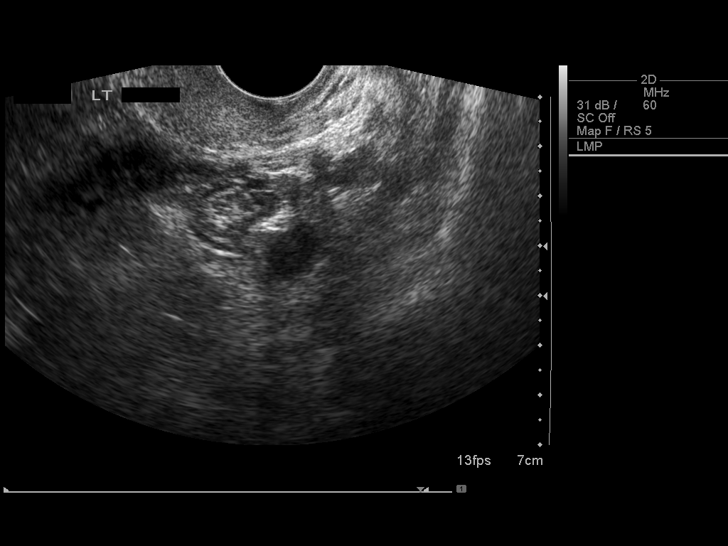
[im 39/52]
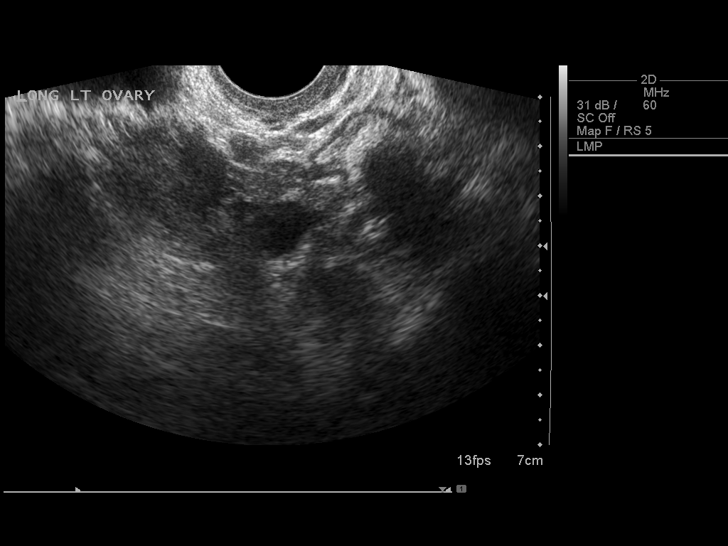
[im 43/52]
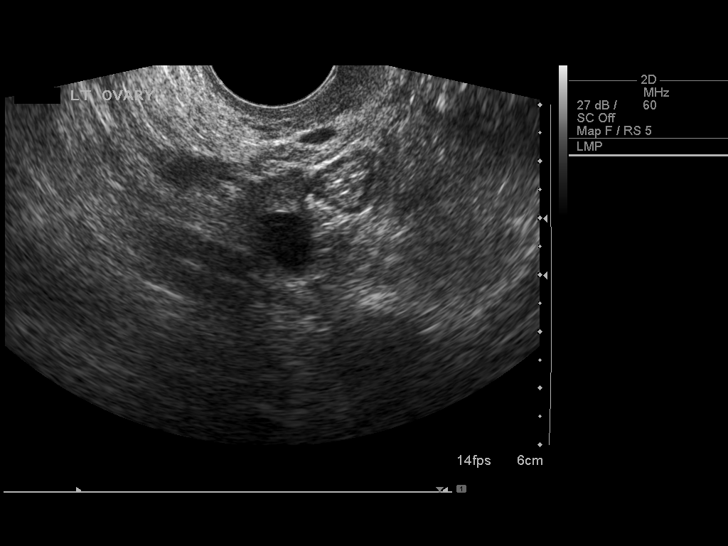
[im 47/52]
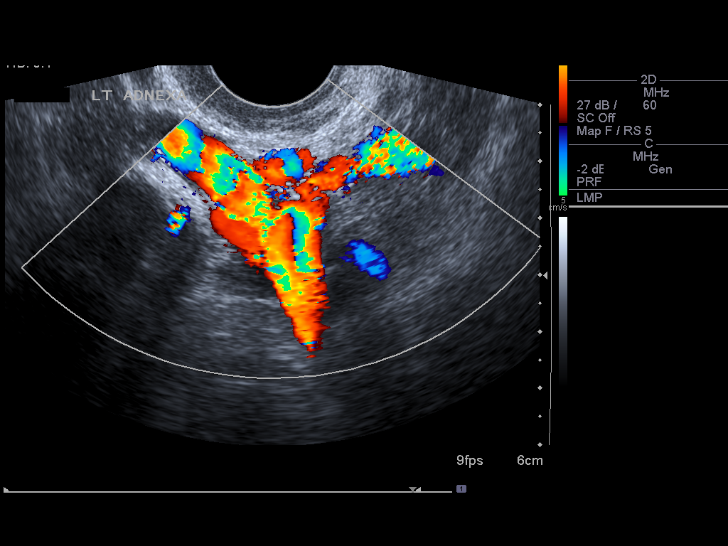
[im 52/52]
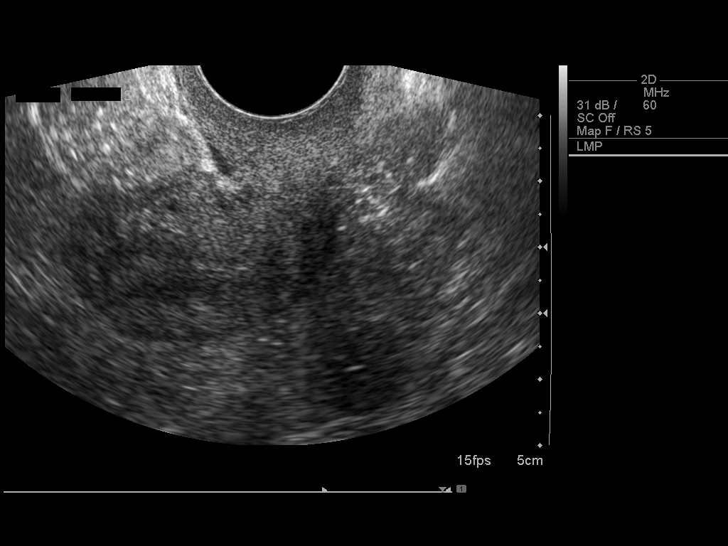

[14 of 25 positions shown; findings below may reference images not displayed]

FINDINGS: Uterus: 6.0 x 2.5 x 3.0 cm.

Endometrium: 5.4 mm.

Right ovary:  Not visualized secondary to bowel gas.

Left ovary: 2.3 x 1.9 x 1.7 cm.  1.2 x 0.8 x 0.9 cm cyst.

Other findings: No free fluid
IMPRESSION: Right ovary not visualized secondary to bowel gas.

Left ovarian 1.2 cm cyst.  Follow-up exam in 1 year recommended
(sooner if clinically indicated).

This is a call report..

## 2016-09-04 DIAGNOSIS — L02821 Furuncle of head [any part, except face]: Secondary | ICD-10-CM | POA: Diagnosis not present

## 2016-09-04 DIAGNOSIS — L0101 Non-bullous impetigo: Secondary | ICD-10-CM | POA: Diagnosis not present

## 2016-09-04 DIAGNOSIS — L718 Other rosacea: Secondary | ICD-10-CM | POA: Diagnosis not present

## 2016-09-27 DIAGNOSIS — R002 Palpitations: Secondary | ICD-10-CM | POA: Diagnosis not present

## 2016-10-02 DIAGNOSIS — I428 Other cardiomyopathies: Secondary | ICD-10-CM | POA: Diagnosis not present

## 2016-10-14 DIAGNOSIS — M5408 Panniculitis affecting regions of neck and back, sacral and sacrococcygeal region: Secondary | ICD-10-CM | POA: Diagnosis not present

## 2016-10-14 DIAGNOSIS — M9903 Segmental and somatic dysfunction of lumbar region: Secondary | ICD-10-CM | POA: Diagnosis not present

## 2016-10-14 DIAGNOSIS — M9901 Segmental and somatic dysfunction of cervical region: Secondary | ICD-10-CM | POA: Diagnosis not present

## 2016-10-14 DIAGNOSIS — M62830 Muscle spasm of back: Secondary | ICD-10-CM | POA: Diagnosis not present

## 2016-10-17 DIAGNOSIS — M9903 Segmental and somatic dysfunction of lumbar region: Secondary | ICD-10-CM | POA: Diagnosis not present

## 2016-10-17 DIAGNOSIS — M5408 Panniculitis affecting regions of neck and back, sacral and sacrococcygeal region: Secondary | ICD-10-CM | POA: Diagnosis not present

## 2016-10-17 DIAGNOSIS — M62830 Muscle spasm of back: Secondary | ICD-10-CM | POA: Diagnosis not present

## 2016-10-17 DIAGNOSIS — M9901 Segmental and somatic dysfunction of cervical region: Secondary | ICD-10-CM | POA: Diagnosis not present

## 2016-10-21 DIAGNOSIS — M62830 Muscle spasm of back: Secondary | ICD-10-CM | POA: Diagnosis not present

## 2016-10-21 DIAGNOSIS — M9903 Segmental and somatic dysfunction of lumbar region: Secondary | ICD-10-CM | POA: Diagnosis not present

## 2016-10-21 DIAGNOSIS — M5408 Panniculitis affecting regions of neck and back, sacral and sacrococcygeal region: Secondary | ICD-10-CM | POA: Diagnosis not present

## 2016-10-21 DIAGNOSIS — M9901 Segmental and somatic dysfunction of cervical region: Secondary | ICD-10-CM | POA: Diagnosis not present

## 2016-10-28 DIAGNOSIS — M62830 Muscle spasm of back: Secondary | ICD-10-CM | POA: Diagnosis not present

## 2016-10-28 DIAGNOSIS — M9901 Segmental and somatic dysfunction of cervical region: Secondary | ICD-10-CM | POA: Diagnosis not present

## 2016-10-28 DIAGNOSIS — M9903 Segmental and somatic dysfunction of lumbar region: Secondary | ICD-10-CM | POA: Diagnosis not present

## 2016-10-28 DIAGNOSIS — M5408 Panniculitis affecting regions of neck and back, sacral and sacrococcygeal region: Secondary | ICD-10-CM | POA: Diagnosis not present

## 2016-11-05 DIAGNOSIS — M62830 Muscle spasm of back: Secondary | ICD-10-CM | POA: Diagnosis not present

## 2016-11-05 DIAGNOSIS — M5408 Panniculitis affecting regions of neck and back, sacral and sacrococcygeal region: Secondary | ICD-10-CM | POA: Diagnosis not present

## 2016-11-05 DIAGNOSIS — M9903 Segmental and somatic dysfunction of lumbar region: Secondary | ICD-10-CM | POA: Diagnosis not present

## 2016-11-05 DIAGNOSIS — M9901 Segmental and somatic dysfunction of cervical region: Secondary | ICD-10-CM | POA: Diagnosis not present

## 2016-11-07 DIAGNOSIS — R002 Palpitations: Secondary | ICD-10-CM | POA: Diagnosis not present

## 2016-11-12 DIAGNOSIS — M5408 Panniculitis affecting regions of neck and back, sacral and sacrococcygeal region: Secondary | ICD-10-CM | POA: Diagnosis not present

## 2016-11-12 DIAGNOSIS — M62830 Muscle spasm of back: Secondary | ICD-10-CM | POA: Diagnosis not present

## 2016-11-12 DIAGNOSIS — M9903 Segmental and somatic dysfunction of lumbar region: Secondary | ICD-10-CM | POA: Diagnosis not present

## 2016-11-12 DIAGNOSIS — M9901 Segmental and somatic dysfunction of cervical region: Secondary | ICD-10-CM | POA: Diagnosis not present

## 2016-11-19 DIAGNOSIS — M9903 Segmental and somatic dysfunction of lumbar region: Secondary | ICD-10-CM | POA: Diagnosis not present

## 2016-11-19 DIAGNOSIS — M9901 Segmental and somatic dysfunction of cervical region: Secondary | ICD-10-CM | POA: Diagnosis not present

## 2016-11-19 DIAGNOSIS — M62830 Muscle spasm of back: Secondary | ICD-10-CM | POA: Diagnosis not present

## 2016-11-19 DIAGNOSIS — M5408 Panniculitis affecting regions of neck and back, sacral and sacrococcygeal region: Secondary | ICD-10-CM | POA: Diagnosis not present

## 2016-11-27 DIAGNOSIS — M9901 Segmental and somatic dysfunction of cervical region: Secondary | ICD-10-CM | POA: Diagnosis not present

## 2016-11-27 DIAGNOSIS — M9903 Segmental and somatic dysfunction of lumbar region: Secondary | ICD-10-CM | POA: Diagnosis not present

## 2016-11-27 DIAGNOSIS — M62830 Muscle spasm of back: Secondary | ICD-10-CM | POA: Diagnosis not present

## 2016-11-27 DIAGNOSIS — M5408 Panniculitis affecting regions of neck and back, sacral and sacrococcygeal region: Secondary | ICD-10-CM | POA: Diagnosis not present

## 2016-12-06 DIAGNOSIS — S0501XA Injury of conjunctiva and corneal abrasion without foreign body, right eye, initial encounter: Secondary | ICD-10-CM | POA: Diagnosis not present

## 2016-12-09 DIAGNOSIS — J329 Chronic sinusitis, unspecified: Secondary | ICD-10-CM | POA: Diagnosis not present

## 2016-12-09 DIAGNOSIS — R5383 Other fatigue: Secondary | ICD-10-CM | POA: Diagnosis not present

## 2016-12-18 DIAGNOSIS — M5408 Panniculitis affecting regions of neck and back, sacral and sacrococcygeal region: Secondary | ICD-10-CM | POA: Diagnosis not present

## 2016-12-18 DIAGNOSIS — M9901 Segmental and somatic dysfunction of cervical region: Secondary | ICD-10-CM | POA: Diagnosis not present

## 2016-12-18 DIAGNOSIS — M9903 Segmental and somatic dysfunction of lumbar region: Secondary | ICD-10-CM | POA: Diagnosis not present

## 2016-12-18 DIAGNOSIS — M62830 Muscle spasm of back: Secondary | ICD-10-CM | POA: Diagnosis not present

## 2016-12-19 DIAGNOSIS — I480 Paroxysmal atrial fibrillation: Secondary | ICD-10-CM | POA: Diagnosis not present

## 2016-12-19 DIAGNOSIS — I10 Essential (primary) hypertension: Secondary | ICD-10-CM | POA: Diagnosis not present

## 2016-12-19 DIAGNOSIS — R42 Dizziness and giddiness: Secondary | ICD-10-CM | POA: Diagnosis not present

## 2016-12-25 DIAGNOSIS — M62830 Muscle spasm of back: Secondary | ICD-10-CM | POA: Diagnosis not present

## 2016-12-25 DIAGNOSIS — R400 Somnolence: Secondary | ICD-10-CM | POA: Diagnosis not present

## 2016-12-25 DIAGNOSIS — M9903 Segmental and somatic dysfunction of lumbar region: Secondary | ICD-10-CM | POA: Diagnosis not present

## 2016-12-25 DIAGNOSIS — M5408 Panniculitis affecting regions of neck and back, sacral and sacrococcygeal region: Secondary | ICD-10-CM | POA: Diagnosis not present

## 2016-12-25 DIAGNOSIS — M9901 Segmental and somatic dysfunction of cervical region: Secondary | ICD-10-CM | POA: Diagnosis not present

## 2016-12-26 DIAGNOSIS — R400 Somnolence: Secondary | ICD-10-CM | POA: Diagnosis not present

## 2016-12-30 DIAGNOSIS — J329 Chronic sinusitis, unspecified: Secondary | ICD-10-CM | POA: Diagnosis not present

## 2017-01-02 DIAGNOSIS — M9901 Segmental and somatic dysfunction of cervical region: Secondary | ICD-10-CM | POA: Diagnosis not present

## 2017-01-02 DIAGNOSIS — M5408 Panniculitis affecting regions of neck and back, sacral and sacrococcygeal region: Secondary | ICD-10-CM | POA: Diagnosis not present

## 2017-01-02 DIAGNOSIS — M9903 Segmental and somatic dysfunction of lumbar region: Secondary | ICD-10-CM | POA: Diagnosis not present

## 2017-01-02 DIAGNOSIS — M62830 Muscle spasm of back: Secondary | ICD-10-CM | POA: Diagnosis not present

## 2017-01-02 DIAGNOSIS — R55 Syncope and collapse: Secondary | ICD-10-CM | POA: Diagnosis not present

## 2017-01-09 DIAGNOSIS — M9903 Segmental and somatic dysfunction of lumbar region: Secondary | ICD-10-CM | POA: Diagnosis not present

## 2017-01-09 DIAGNOSIS — M62830 Muscle spasm of back: Secondary | ICD-10-CM | POA: Diagnosis not present

## 2017-01-09 DIAGNOSIS — M5408 Panniculitis affecting regions of neck and back, sacral and sacrococcygeal region: Secondary | ICD-10-CM | POA: Diagnosis not present

## 2017-01-09 DIAGNOSIS — M9901 Segmental and somatic dysfunction of cervical region: Secondary | ICD-10-CM | POA: Diagnosis not present

## 2017-02-26 DIAGNOSIS — G4733 Obstructive sleep apnea (adult) (pediatric): Secondary | ICD-10-CM | POA: Diagnosis not present

## 2017-03-14 DIAGNOSIS — L718 Other rosacea: Secondary | ICD-10-CM | POA: Diagnosis not present

## 2017-03-14 DIAGNOSIS — L738 Other specified follicular disorders: Secondary | ICD-10-CM | POA: Diagnosis not present

## 2017-03-14 DIAGNOSIS — L02821 Furuncle of head [any part, except face]: Secondary | ICD-10-CM | POA: Diagnosis not present

## 2017-04-02 DIAGNOSIS — I480 Paroxysmal atrial fibrillation: Secondary | ICD-10-CM | POA: Diagnosis not present

## 2017-04-02 DIAGNOSIS — M810 Age-related osteoporosis without current pathological fracture: Secondary | ICD-10-CM | POA: Diagnosis not present

## 2017-04-02 DIAGNOSIS — I4891 Unspecified atrial fibrillation: Secondary | ICD-10-CM | POA: Diagnosis not present

## 2017-04-02 DIAGNOSIS — Z79899 Other long term (current) drug therapy: Secondary | ICD-10-CM | POA: Diagnosis not present

## 2017-04-02 DIAGNOSIS — Z1331 Encounter for screening for depression: Secondary | ICD-10-CM | POA: Diagnosis not present

## 2017-04-02 DIAGNOSIS — F419 Anxiety disorder, unspecified: Secondary | ICD-10-CM | POA: Diagnosis not present

## 2017-04-02 DIAGNOSIS — F988 Other specified behavioral and emotional disorders with onset usually occurring in childhood and adolescence: Secondary | ICD-10-CM | POA: Diagnosis not present

## 2017-04-16 DIAGNOSIS — I480 Paroxysmal atrial fibrillation: Secondary | ICD-10-CM | POA: Diagnosis not present

## 2017-04-28 DIAGNOSIS — M25561 Pain in right knee: Secondary | ICD-10-CM | POA: Diagnosis not present

## 2017-04-28 DIAGNOSIS — I4891 Unspecified atrial fibrillation: Secondary | ICD-10-CM | POA: Diagnosis not present

## 2017-04-28 DIAGNOSIS — M25562 Pain in left knee: Secondary | ICD-10-CM | POA: Diagnosis not present

## 2017-05-20 DIAGNOSIS — F419 Anxiety disorder, unspecified: Secondary | ICD-10-CM | POA: Diagnosis not present

## 2017-05-20 DIAGNOSIS — D72819 Decreased white blood cell count, unspecified: Secondary | ICD-10-CM | POA: Diagnosis not present

## 2017-05-20 DIAGNOSIS — D696 Thrombocytopenia, unspecified: Secondary | ICD-10-CM | POA: Diagnosis not present

## 2017-05-27 DIAGNOSIS — D72819 Decreased white blood cell count, unspecified: Secondary | ICD-10-CM | POA: Diagnosis not present

## 2017-05-27 DIAGNOSIS — D696 Thrombocytopenia, unspecified: Secondary | ICD-10-CM | POA: Diagnosis not present

## 2017-07-02 DIAGNOSIS — R748 Abnormal levels of other serum enzymes: Secondary | ICD-10-CM | POA: Diagnosis not present

## 2017-07-02 DIAGNOSIS — R7989 Other specified abnormal findings of blood chemistry: Secondary | ICD-10-CM | POA: Diagnosis not present

## 2017-07-02 DIAGNOSIS — R5383 Other fatigue: Secondary | ICD-10-CM | POA: Diagnosis not present

## 2017-07-02 DIAGNOSIS — D696 Thrombocytopenia, unspecified: Secondary | ICD-10-CM | POA: Diagnosis not present

## 2017-08-21 DIAGNOSIS — I480 Paroxysmal atrial fibrillation: Secondary | ICD-10-CM | POA: Diagnosis not present

## 2017-09-03 DIAGNOSIS — L298 Other pruritus: Secondary | ICD-10-CM | POA: Diagnosis not present

## 2017-09-03 DIAGNOSIS — L820 Inflamed seborrheic keratosis: Secondary | ICD-10-CM | POA: Diagnosis not present

## 2017-09-03 DIAGNOSIS — L538 Other specified erythematous conditions: Secondary | ICD-10-CM | POA: Diagnosis not present

## 2017-09-03 DIAGNOSIS — L718 Other rosacea: Secondary | ICD-10-CM | POA: Diagnosis not present

## 2017-09-03 DIAGNOSIS — L570 Actinic keratosis: Secondary | ICD-10-CM | POA: Diagnosis not present

## 2017-10-10 DIAGNOSIS — H018 Other specified inflammations of eyelid: Secondary | ICD-10-CM | POA: Diagnosis not present

## 2017-10-10 DIAGNOSIS — H16143 Punctate keratitis, bilateral: Secondary | ICD-10-CM | POA: Diagnosis not present

## 2017-10-18 DIAGNOSIS — Z7901 Long term (current) use of anticoagulants: Secondary | ICD-10-CM | POA: Diagnosis not present

## 2017-10-18 DIAGNOSIS — Z79899 Other long term (current) drug therapy: Secondary | ICD-10-CM | POA: Diagnosis not present

## 2017-10-18 DIAGNOSIS — H0012 Chalazion right lower eyelid: Secondary | ICD-10-CM | POA: Diagnosis not present

## 2017-10-24 DIAGNOSIS — H04123 Dry eye syndrome of bilateral lacrimal glands: Secondary | ICD-10-CM | POA: Diagnosis not present

## 2017-10-27 DIAGNOSIS — L03317 Cellulitis of buttock: Secondary | ICD-10-CM | POA: Diagnosis not present

## 2017-10-27 DIAGNOSIS — W57XXXA Bitten or stung by nonvenomous insect and other nonvenomous arthropods, initial encounter: Secondary | ICD-10-CM | POA: Diagnosis not present

## 2017-11-07 DIAGNOSIS — W57XXXA Bitten or stung by nonvenomous insect and other nonvenomous arthropods, initial encounter: Secondary | ICD-10-CM | POA: Diagnosis not present

## 2017-11-07 DIAGNOSIS — Z9181 History of falling: Secondary | ICD-10-CM | POA: Diagnosis not present

## 2017-11-07 DIAGNOSIS — H0019 Chalazion unspecified eye, unspecified eyelid: Secondary | ICD-10-CM | POA: Diagnosis not present

## 2017-11-07 DIAGNOSIS — D696 Thrombocytopenia, unspecified: Secondary | ICD-10-CM | POA: Diagnosis not present

## 2017-11-07 DIAGNOSIS — D72819 Decreased white blood cell count, unspecified: Secondary | ICD-10-CM | POA: Diagnosis not present

## 2017-11-21 DIAGNOSIS — A799 Rickettsiosis, unspecified: Secondary | ICD-10-CM | POA: Diagnosis not present

## 2017-11-21 DIAGNOSIS — S80862A Insect bite (nonvenomous), left lower leg, initial encounter: Secondary | ICD-10-CM | POA: Diagnosis not present

## 2017-11-21 DIAGNOSIS — W57XXXA Bitten or stung by nonvenomous insect and other nonvenomous arthropods, initial encounter: Secondary | ICD-10-CM | POA: Diagnosis not present

## 2017-11-21 DIAGNOSIS — W64XXXA Exposure to other animate mechanical forces, initial encounter: Secondary | ICD-10-CM | POA: Diagnosis not present

## 2017-12-04 DIAGNOSIS — L298 Other pruritus: Secondary | ICD-10-CM | POA: Diagnosis not present

## 2017-12-04 DIAGNOSIS — L718 Other rosacea: Secondary | ICD-10-CM | POA: Diagnosis not present

## 2017-12-04 DIAGNOSIS — L810 Postinflammatory hyperpigmentation: Secondary | ICD-10-CM | POA: Diagnosis not present

## 2017-12-04 DIAGNOSIS — L538 Other specified erythematous conditions: Secondary | ICD-10-CM | POA: Diagnosis not present

## 2017-12-04 DIAGNOSIS — L570 Actinic keratosis: Secondary | ICD-10-CM | POA: Diagnosis not present

## 2017-12-04 DIAGNOSIS — L820 Inflamed seborrheic keratosis: Secondary | ICD-10-CM | POA: Diagnosis not present

## 2018-01-09 DIAGNOSIS — L0390 Cellulitis, unspecified: Secondary | ICD-10-CM | POA: Diagnosis not present

## 2018-01-09 DIAGNOSIS — K047 Periapical abscess without sinus: Secondary | ICD-10-CM | POA: Diagnosis not present

## 2018-01-21 DIAGNOSIS — I4891 Unspecified atrial fibrillation: Secondary | ICD-10-CM | POA: Diagnosis not present

## 2018-01-21 DIAGNOSIS — L659 Nonscarring hair loss, unspecified: Secondary | ICD-10-CM | POA: Diagnosis not present

## 2018-01-21 DIAGNOSIS — F411 Generalized anxiety disorder: Secondary | ICD-10-CM | POA: Diagnosis not present

## 2018-01-21 DIAGNOSIS — L719 Rosacea, unspecified: Secondary | ICD-10-CM | POA: Diagnosis not present

## 2018-01-21 DIAGNOSIS — Z6821 Body mass index (BMI) 21.0-21.9, adult: Secondary | ICD-10-CM | POA: Diagnosis not present

## 2018-01-21 DIAGNOSIS — I429 Cardiomyopathy, unspecified: Secondary | ICD-10-CM | POA: Diagnosis not present

## 2018-01-22 DIAGNOSIS — R430 Anosmia: Secondary | ICD-10-CM | POA: Diagnosis not present

## 2018-01-22 DIAGNOSIS — R51 Headache: Secondary | ICD-10-CM | POA: Diagnosis not present

## 2018-01-22 DIAGNOSIS — J329 Chronic sinusitis, unspecified: Secondary | ICD-10-CM | POA: Diagnosis not present

## 2018-01-28 ENCOUNTER — Other Ambulatory Visit: Payer: Self-pay | Admitting: Otolaryngology

## 2018-01-28 DIAGNOSIS — R519 Headache, unspecified: Secondary | ICD-10-CM

## 2018-01-28 DIAGNOSIS — R51 Headache: Principal | ICD-10-CM

## 2018-02-07 ENCOUNTER — Ambulatory Visit
Admission: RE | Admit: 2018-02-07 | Discharge: 2018-02-07 | Disposition: A | Discharge status: Home / Self Care | Payer: Self-pay | Source: Ambulatory Visit | Attending: Otolaryngology | Admitting: Otolaryngology

## 2018-02-07 DIAGNOSIS — H538 Other visual disturbances: Secondary | ICD-10-CM | POA: Diagnosis not present

## 2018-02-07 DIAGNOSIS — R51 Headache: Principal | ICD-10-CM

## 2018-02-07 DIAGNOSIS — R519 Headache, unspecified: Secondary | ICD-10-CM

## 2018-02-17 ENCOUNTER — Ambulatory Visit (INDEPENDENT_AMBULATORY_CARE_PROVIDER_SITE_OTHER): Payer: Medicare Other | Admitting: Cardiovascular Disease

## 2018-02-17 ENCOUNTER — Encounter: Payer: Self-pay | Admitting: Cardiovascular Disease

## 2018-02-17 VITALS — BP 162/90 | HR 67 | Ht 67.5 in | Wt 139.4 lb

## 2018-02-17 DIAGNOSIS — I480 Paroxysmal atrial fibrillation: Secondary | ICD-10-CM | POA: Insufficient documentation

## 2018-02-17 DIAGNOSIS — I425 Other restrictive cardiomyopathy: Secondary | ICD-10-CM | POA: Diagnosis not present

## 2018-02-17 DIAGNOSIS — E785 Hyperlipidemia, unspecified: Secondary | ICD-10-CM | POA: Insufficient documentation

## 2018-02-17 DIAGNOSIS — E7849 Other hyperlipidemia: Secondary | ICD-10-CM
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
## 2018-02-17 NOTE — Assessment & Plan Note (Signed)
History of hyperlipidemia in the past not on statin therapy although she tells me that lipid profile is better since focusing on diet and exercise.  We will recheck a lipid and liver profile.
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
## 2018-02-18 DIAGNOSIS — I493 Ventricular premature depolarization: Secondary | ICD-10-CM | POA: Diagnosis not present

## 2018-02-18 DIAGNOSIS — E7849 Other hyperlipidemia: Secondary | ICD-10-CM | POA: Diagnosis not present

## 2018-02-18 DIAGNOSIS — I4891 Unspecified atrial fibrillation: Secondary | ICD-10-CM | POA: Diagnosis not present

## 2018-02-18 DIAGNOSIS — I429 Cardiomyopathy, unspecified: Secondary | ICD-10-CM | POA: Diagnosis not present

## 2018-02-18 DIAGNOSIS — F411 Generalized anxiety disorder: Secondary | ICD-10-CM | POA: Diagnosis not present

## 2018-02-19 ENCOUNTER — Encounter: Payer: Self-pay | Admitting: *Deleted

## 2018-02-19 ENCOUNTER — Telehealth: Payer: Self-pay | Admitting: Cardiovascular Disease

## 2018-02-19 LAB — HEPATIC FUNCTION PANEL
ALBUMIN: 4.5 g/dL (ref 3.6–4.8)
ALT: 24 IU/L (ref 0–32)
AST: 27 IU/L (ref 0–40)
Alkaline Phosphatase: 52 IU/L (ref 39–117)
Bilirubin Total: 0.6 mg/dL (ref 0.0–1.2)
Bilirubin, Direct: 0.15 mg/dL (ref 0.00–0.40)
Total Protein: 6.8 g/dL (ref 6.0–8.5)

## 2018-02-19 LAB — LIPID PANEL
Chol/HDL Ratio: 2.9 ratio (ref 0.0–4.4)
Cholesterol, Total: 211 mg/dL — ABNORMAL HIGH (ref 100–199)
HDL: 73 mg/dL (ref >39)
LDL CALC: 123 mg/dL — AB (ref 0–99)
Triglycerides: 75 mg/dL (ref 0–149)
VLDL CHOLESTEROL CAL: 15 mg/dL (ref 5–40)

## 2018-02-19 NOTE — Telephone Encounter (Signed)
New Message       [facility] Pre-operative Risk Assessment    Request for surgical clearance:  1. What type of surgery is being performed? Tooth extraction   2. When is this surgery scheduled? TBD   3. What type of clearance is required (medical clearance vs. Pharmacy clearance to hold med vs. Both)? Medical Clearance  4. Are there any medications that need to be held prior to surgery and how long? no   5. Practice name and name of physician performing surgery? [facility] Dr. Larkin Ina Drab  6. What is your office phone number [phone]   7.   What is your office fax number [phone]  8.   Anesthesia type (None, local, MAC, general) ? IV Sedation   Christine Shaffer 02/19/2018, 2:23 PM  _________________________________________________________________   (provider comments below)

## 2018-02-20 NOTE — Telephone Encounter (Signed)
Dr. Adora Fridge You recently saw Christine Shaffer  And ordered echo and event monitor.  Can she proceed with tooth extraction.  ?

## 2018-02-20 NOTE — Telephone Encounter (Signed)
Yes, Ms. Christine Shaffer can proceed with tooth extraction.

## 2018-02-25 DIAGNOSIS — L570 Actinic keratosis: Secondary | ICD-10-CM | POA: Diagnosis not present

## 2018-02-25 DIAGNOSIS — D3614 Benign neoplasm of peripheral nerves and autonomic nervous system of thorax: Secondary | ICD-10-CM | POA: Diagnosis not present

## 2018-02-25 DIAGNOSIS — L538 Other specified erythematous conditions: Secondary | ICD-10-CM | POA: Diagnosis not present

## 2018-02-25 DIAGNOSIS — L718 Other rosacea: Secondary | ICD-10-CM | POA: Diagnosis not present

## 2018-02-25 DIAGNOSIS — L298 Other pruritus: Secondary | ICD-10-CM | POA: Diagnosis not present

## 2018-02-25 DIAGNOSIS — L820 Inflamed seborrheic keratosis: Secondary | ICD-10-CM | POA: Diagnosis not present

## 2018-02-25 DIAGNOSIS — D485 Neoplasm of uncertain behavior of skin: Secondary | ICD-10-CM | POA: Diagnosis not present

## 2018-02-27 DIAGNOSIS — D696 Thrombocytopenia, unspecified: Secondary | ICD-10-CM | POA: Diagnosis not present

## 2018-02-27 DIAGNOSIS — D72819 Decreased white blood cell count, unspecified: Secondary | ICD-10-CM | POA: Diagnosis not present

## 2018-03-02 ENCOUNTER — Ambulatory Visit (INDEPENDENT_AMBULATORY_CARE_PROVIDER_SITE_OTHER): Payer: Medicare Other

## 2018-03-02 ENCOUNTER — Ambulatory Visit (HOSPITAL_COMMUNITY): Payer: Medicare Other | Attending: Cardiovascular Disease

## 2018-03-02 ENCOUNTER — Other Ambulatory Visit: Payer: Self-pay

## 2018-03-02 DIAGNOSIS — I425 Other restrictive cardiomyopathy: Secondary | ICD-10-CM

## 2018-03-02 DIAGNOSIS — I480 Paroxysmal atrial fibrillation: Secondary | ICD-10-CM

## 2018-03-02 NOTE — Telephone Encounter (Addendum)
   Primary Cardiologist:Jonathan Gwenlyn Found, MD  Chart reviewed as part of pre-operative protocol coverage. Pre-op clearance already addressed by colleagues in earlier phone notes. Dr. Gwenlyn Found indicates this patient can proceed with tooth extraction. Addendum: no present indication noted for SBE ppx.  Will route this bundled recommendation to requesting provider via Epic fax function. Please call with questions.  Charlie Pitter, PA-C 03/02/2018, 2:21 PM

## 2018-03-23 DIAGNOSIS — Z6821 Body mass index (BMI) 21.0-21.9, adult: Secondary | ICD-10-CM | POA: Diagnosis not present

## 2018-03-23 DIAGNOSIS — Z1211 Encounter for screening for malignant neoplasm of colon: Secondary | ICD-10-CM | POA: Diagnosis not present

## 2018-03-23 DIAGNOSIS — Z0000 Encounter for general adult medical examination without abnormal findings: Secondary | ICD-10-CM | POA: Diagnosis not present

## 2018-03-24 DIAGNOSIS — G8929 Other chronic pain: Secondary | ICD-10-CM | POA: Diagnosis not present

## 2018-03-24 DIAGNOSIS — M1711 Unilateral primary osteoarthritis, right knee: Secondary | ICD-10-CM | POA: Diagnosis not present

## 2018-03-26 ENCOUNTER — Other Ambulatory Visit: Payer: Self-pay | Admitting: Family Medicine

## 2018-03-26 DIAGNOSIS — E2839 Other primary ovarian failure: Secondary | ICD-10-CM

## 2018-03-26 DIAGNOSIS — Z1231 Encounter for screening mammogram for malignant neoplasm of breast: Secondary | ICD-10-CM

## 2018-03-31 ENCOUNTER — Telehealth: Payer: Self-pay | Admitting: Cardiovascular Disease

## 2018-03-31 NOTE — Telephone Encounter (Signed)
STAT if HR is under 50 or over 120 (normal HR is 60-100 beats per minute)  1) What is your heart rate? HR went under 50 last night. She think from her FitBit  2) Do you have a log of your heart rate readings (document readings)? no  3) Do you have any other symptoms?  No   Patient states the Holter Monitor company called her at 2:30am this morning.  She said she has been on pain meds from dental work she had done last week.

## 2018-03-31 NOTE — Telephone Encounter (Signed)
Spoke with pt who state she received an alert to contact preventive. Pt state she spoke with a rep who asked what she was doing around 1 am. Pt reports she was sleeping at the time and was concerned the alert could be related to her fitbit showing her HR dropping in the 50's the last few nights due to taking hydrocodone. Pt denies any symptoms and reports she feels fine.  Report downloaded from [employer] which showed A fib RVR with a HR of 200. Report reviewed by Dr. Gwenlyn Found who recommend pt schedule an appointment. Pt updated. Appointment schedule for tomorrow 04/01/18 at 945 am.

## 2018-04-01 ENCOUNTER — Encounter: Payer: Self-pay | Admitting: Cardiovascular Disease

## 2018-04-01 ENCOUNTER — Ambulatory Visit (INDEPENDENT_AMBULATORY_CARE_PROVIDER_SITE_OTHER): Payer: Medicare Other | Admitting: Cardiovascular Disease

## 2018-04-01 VITALS — BP 116/60 | HR 72 | Ht 67.5 in | Wt 137.0 lb

## 2018-04-01 DIAGNOSIS — I480 Paroxysmal atrial fibrillation: Secondary | ICD-10-CM | POA: Diagnosis not present

## 2018-04-01 DIAGNOSIS — Z79899 Other long term (current) drug therapy: Secondary | ICD-10-CM | POA: Diagnosis not present

## 2018-04-01 LAB — CBC WITH DIFFERENTIAL/PLATELET
BASOS ABS: 0 10*3/uL (ref 0.0–0.2)
BASOS: 1 %
EOS (ABSOLUTE): 0.1 10*3/uL (ref 0.0–0.4)
Eos: 4 %
HEMATOCRIT: 41.5 % (ref 34.0–46.6)
Hemoglobin: 14.3 g/dL (ref 11.1–15.9)
Immature Grans (Abs): 0 10*3/uL (ref 0.0–0.1)
Immature Granulocytes: 0 %
Lymphocytes Absolute: 1.2 10*3/uL (ref 0.7–3.1)
Lymphs: 36 %
MCH: 31.6 pg (ref 26.6–33.0)
MCHC: 34.5 g/dL (ref 31.5–35.7)
MCV: 92 fL (ref 79–97)
MONOS ABS: 0.3 10*3/uL (ref 0.1–0.9)
Monocytes: 10 %
NEUTROS ABS: 1.6 10*3/uL (ref 1.4–7.0)
Neutrophils: 49 %
Platelets: 148 10*3/uL — ABNORMAL LOW (ref 150–450)
RBC: 4.53 x10E6/uL (ref 3.77–5.28)
RDW: 12.5 % (ref 12.3–15.4)
WBC: 3.2 10*3/uL — ABNORMAL LOW (ref 3.4–10.8)

## 2018-04-01 LAB — BASIC METABOLIC PANEL
BUN/Creatinine Ratio: 14 (ref 12–28)
BUN: 15 mg/dL (ref 8–27)
CALCIUM: 9.8 mg/dL (ref 8.7–10.3)
CHLORIDE: 99 mmol/L (ref 96–106)
CO2: 24 mmol/L (ref 20–29)
Creatinine, Ser: 1.09 mg/dL — ABNORMAL HIGH (ref 0.57–1.00)
GFR, EST AFRICAN AMERICAN: 61 mL/min/{1.73_m2} (ref >59)
GFR, EST NON AFRICAN AMERICAN: 53 mL/min/{1.73_m2} — AB (ref >59)
Glucose: 92 mg/dL (ref 65–99)
POTASSIUM: 4.7 mmol/L (ref 3.5–5.2)
SODIUM: 140 mmol/L (ref 134–144)

## 2018-04-01 LAB — TSH: TSH: 1.32 u[IU]/mL (ref 0.450–4.500)

## 2018-04-01 LAB — T4, FREE: Free T4: 1.38 ng/dL (ref 0.82–1.77)

## 2018-04-01 MED ORDER — APIXABAN 5 MG PO TABS: 5.0000 mg | ORAL_TABLET | Freq: Two times a day (BID) | ORAL | 3 refills | Status: DC

## 2018-04-01 NOTE — Progress Notes (Signed)
Christine Shaffer returns today for follow-up.  Her 30-day event monitor showed PAF with RVR on Monday night at rates up to 100.  She is on low-dose metoprolol and low-dose flecainide.  She has been on Eliquis in the past which she discontinued. This patients CHA2DS2-VASc Score and unadjusted Ischemic Stroke Rate (% per year) is equal to 2.2 % stroke rate/year from a score of 2.  I am going to start her on Eliquis oral anticoagulation and refer her to the A. fib clinic.  She may need up titration of her flecainide or discussion of A. fib ablation.  She is having episodes of A. fib up to several times a week.  Check thyroid function tests as well.  Above score calculated as 1 point each if present [CHF, HTN, DM, Vascular=MI/PAD/Aortic Plaque, Age if 65-74, or Female] Above score calculated as 2 points each if present [Age > 75, or Stroke/TIA/TE]  Christine Shaffer, M.D., [place], [facility], [place], [place] [address] [place], [place]  [address]  [phone] 04/01/2018 10:32 AM

## 2018-04-01 NOTE — Patient Instructions (Signed)
Medication Instructions:  Your physician has recommended you make the following change in your medication:  1) START Eliquis 5mg  tablet by mouth TWICE daily   If you need a refill on your cardiac medications before your next appointment, please call your pharmacy.   Lab work: Your physician recommends that you return for lab work in: TODAY  If you have labs (blood work) drawn today and your tests are completely normal, you will receive your results only by: Marland Kitchen MyChart Message (if you have MyChart) OR . A paper copy in the mail If you have any lab test that is abnormal or we need to change your treatment, we will call you to review the results.  Testing/Procedures: NONE  Follow-Up: At [facility], you and your health needs are our priority.  As part of our continuing mission to provide you with exceptional heart care, we have created designated Provider Care Teams.  These Care Teams include your primary Cardiologist (physician) and Advanced Practice Providers (APPs -  Physician Assistants and Nurse Practitioners) who all work together to provide you with the care you need, when you need it. You will need a follow up appointment in 6 months.  Please call our office 2 months in advance to schedule this appointment.  You may see Quay Burow, MD or one of the following Advanced Practice Providers on your designated Care Team:   Kerin Ransom, PA-C Roby Lofts, [place] . Sande Rives, PA-C  Any Other Special Instructions Will Be Listed Below (If Applicable). You have been referred to A. Fib clinic - this week

## 2018-04-01 NOTE — Assessment & Plan Note (Signed)
Christine Shaffer returns today for follow-up.  Christine Shaffer 30-day event monitor showed PAF with RVR on Monday night at rates up to 100.  She is on low-dose metoprolol and low-dose flecainide.  She has been on Eliquis in the past which she discontinued. This patients CHA2DS2-VASc Score and unadjusted Ischemic Stroke Rate (% per year) is equal to 2.2 % stroke rate/year from a score of 2.  I am going to start Christine Shaffer on Eliquis oral anticoagulation and refer Christine Shaffer to the A. fib clinic.  She may need up titration of Christine Shaffer flecainide or discussion of A. fib ablation.  She is having episodes of A. fib up to several times a week.  Check thyroid function tests as well.  Above score calculated as 1 point each if present [CHF, HTN, DM, Vascular=MI/PAD/Aortic Plaque, Age if 65-74, or Female] Above score calculated as 2 points each if present [Age > 75, or Stroke/TIA/TE]

## 2018-04-02 ENCOUNTER — Telehealth: Payer: Self-pay | Admitting: *Deleted

## 2018-04-02 NOTE — Telephone Encounter (Signed)
Left message regarding A Fib Clinic appointment scheduled for Tuesday 04/07/18 @ 2:00pm.  Requested patient call to get parking instructions.

## 2018-04-06 NOTE — Telephone Encounter (Signed)
Left message for patient to call regarding appointment 04/07/18 at2:00pm at the A Fib clinic.

## 2018-04-07 ENCOUNTER — Ambulatory Visit (HOSPITAL_COMMUNITY)
Admission: RE | Admit: 2018-04-07 | Discharge: 2018-04-07 | Disposition: A | Discharge status: Home / Self Care | Payer: Medicare Other | Source: Ambulatory Visit | Attending: Nurse Practitioner | Admitting: Nurse Practitioner

## 2018-04-07 ENCOUNTER — Encounter (HOSPITAL_COMMUNITY): Payer: Self-pay | Admitting: Nurse Practitioner

## 2018-04-07 VITALS — BP 114/68 | HR 76 | Ht 67.5 in | Wt 136.0 lb

## 2018-04-07 DIAGNOSIS — M1990 Unspecified osteoarthritis, unspecified site: Secondary | ICD-10-CM | POA: Diagnosis not present

## 2018-04-07 DIAGNOSIS — I480 Paroxysmal atrial fibrillation: Secondary | ICD-10-CM | POA: Insufficient documentation

## 2018-04-07 DIAGNOSIS — F329 Major depressive disorder, single episode, unspecified: Secondary | ICD-10-CM | POA: Diagnosis not present

## 2018-04-07 DIAGNOSIS — Z79899 Other long term (current) drug therapy: Secondary | ICD-10-CM | POA: Insufficient documentation

## 2018-04-07 DIAGNOSIS — F419 Anxiety disorder, unspecified: Secondary | ICD-10-CM | POA: Insufficient documentation
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
## 2018-04-20 DIAGNOSIS — M810 Age-related osteoporosis without current pathological fracture: Secondary | ICD-10-CM | POA: Diagnosis not present

## 2018-04-20 DIAGNOSIS — F411 Generalized anxiety disorder: Secondary | ICD-10-CM | POA: Diagnosis not present

## 2018-04-20 DIAGNOSIS — Z23 Encounter for immunization: Secondary | ICD-10-CM | POA: Diagnosis not present

## 2018-04-20 DIAGNOSIS — M8580 Other specified disorders of bone density and structure, unspecified site: Secondary | ICD-10-CM | POA: Diagnosis not present

## 2018-04-20 DIAGNOSIS — Z6821 Body mass index (BMI) 21.0-21.9, adult: Secondary | ICD-10-CM | POA: Diagnosis not present

## 2018-04-20 DIAGNOSIS — E559 Vitamin D deficiency, unspecified: Secondary | ICD-10-CM | POA: Diagnosis not present

## 2018-04-21 ENCOUNTER — Encounter (HOSPITAL_COMMUNITY): Payer: Self-pay | Admitting: Nurse Practitioner

## 2018-04-21 ENCOUNTER — Ambulatory Visit (HOSPITAL_COMMUNITY)
Admission: RE | Admit: 2018-04-21 | Discharge: 2018-04-21 | Disposition: A | Discharge status: Home / Self Care | Payer: Medicare Other | Source: Ambulatory Visit | Attending: Nurse Practitioner | Admitting: Nurse Practitioner

## 2018-04-21 VITALS — BP 162/92 | HR 85 | Ht 67.5 in | Wt 141.0 lb

## 2018-04-21 DIAGNOSIS — F419 Anxiety disorder, unspecified: Secondary | ICD-10-CM | POA: Insufficient documentation

## 2018-04-21 DIAGNOSIS — Z8249 Family history of ischemic heart disease and other diseases of the circulatory system: Secondary | ICD-10-CM | POA: Insufficient documentation

## 2018-04-21 DIAGNOSIS — F329 Major depressive disorder, single episode, unspecified: Secondary | ICD-10-CM | POA: Insufficient documentation

## 2018-04-21 DIAGNOSIS — I480 Paroxysmal atrial fibrillation: Secondary | ICD-10-CM

## 2018-04-21 DIAGNOSIS — Z79899 Other long term (current) drug therapy: Secondary | ICD-10-CM | POA: Insufficient documentation
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
## 2018-05-20 ENCOUNTER — Ambulatory Visit: Payer: Self-pay | Admitting: Cardiovascular Disease

## 2018-06-15 ENCOUNTER — Ambulatory Visit: Payer: Medicare Other | Admitting: Psychology

## 2018-06-23 ENCOUNTER — Encounter: Payer: Self-pay | Admitting: Family Medicine

## 2018-07-09 DIAGNOSIS — M62830 Muscle spasm of back: Secondary | ICD-10-CM | POA: Diagnosis not present

## 2018-07-09 DIAGNOSIS — M546 Pain in thoracic spine: Secondary | ICD-10-CM | POA: Diagnosis not present

## 2018-07-09 DIAGNOSIS — M9902 Segmental and somatic dysfunction of thoracic region: Secondary | ICD-10-CM | POA: Diagnosis not present

## 2018-07-09 DIAGNOSIS — M9901 Segmental and somatic dysfunction of cervical region: Secondary | ICD-10-CM | POA: Diagnosis not present

## 2018-07-13 DIAGNOSIS — M9901 Segmental and somatic dysfunction of cervical region: Secondary | ICD-10-CM | POA: Diagnosis not present

## 2018-07-13 DIAGNOSIS — M62830 Muscle spasm of back: Secondary | ICD-10-CM | POA: Diagnosis not present

## 2018-07-13 DIAGNOSIS — M546 Pain in thoracic spine: Secondary | ICD-10-CM | POA: Diagnosis not present

## 2018-07-13 DIAGNOSIS — M9902 Segmental and somatic dysfunction of thoracic region: Secondary | ICD-10-CM | POA: Diagnosis not present

## 2018-09-14 ENCOUNTER — Telehealth: Payer: Self-pay | Admitting: *Deleted

## 2018-09-14 NOTE — Telephone Encounter (Signed)
09/14/18 LMOM @ 3:27 pm,re: follow up appointment.

## 2018-10-14 DIAGNOSIS — L570 Actinic keratosis: Secondary | ICD-10-CM | POA: Diagnosis not present

## 2018-10-14 DIAGNOSIS — L298 Other pruritus: Secondary | ICD-10-CM | POA: Diagnosis not present

## 2018-10-14 DIAGNOSIS — Z789 Other specified health status: Secondary | ICD-10-CM | POA: Diagnosis not present

## 2018-10-14 DIAGNOSIS — L02821 Furuncle of head [any part, except face]: Secondary | ICD-10-CM | POA: Diagnosis not present

## 2018-10-14 DIAGNOSIS — L0101 Non-bullous impetigo: Secondary | ICD-10-CM | POA: Diagnosis not present

## 2018-10-14 DIAGNOSIS — L820 Inflamed seborrheic keratosis: Secondary | ICD-10-CM | POA: Diagnosis not present

## 2018-10-14 DIAGNOSIS — L538 Other specified erythematous conditions: Secondary | ICD-10-CM | POA: Diagnosis not present

## 2018-10-14 DIAGNOSIS — L718 Other rosacea: Secondary | ICD-10-CM | POA: Diagnosis not present

## 2018-11-04 DIAGNOSIS — R238 Other skin changes: Secondary | ICD-10-CM | POA: Diagnosis not present

## 2018-11-04 DIAGNOSIS — R591 Generalized enlarged lymph nodes: Secondary | ICD-10-CM | POA: Diagnosis not present

## 2018-11-04 DIAGNOSIS — R6889 Other general symptoms and signs: Secondary | ICD-10-CM | POA: Diagnosis not present

## 2018-11-04 DIAGNOSIS — M810 Age-related osteoporosis without current pathological fracture: Secondary | ICD-10-CM | POA: Diagnosis not present

## 2018-11-04 DIAGNOSIS — F988 Other specified behavioral and emotional disorders with onset usually occurring in childhood and adolescence: Secondary | ICD-10-CM | POA: Diagnosis not present

## 2018-11-04 DIAGNOSIS — I4891 Unspecified atrial fibrillation: Secondary | ICD-10-CM | POA: Diagnosis not present

## 2018-11-04 DIAGNOSIS — F439 Reaction to severe stress, unspecified: Secondary | ICD-10-CM | POA: Diagnosis not present

## 2019-01-07 DIAGNOSIS — R591 Generalized enlarged lymph nodes: Secondary | ICD-10-CM | POA: Diagnosis not present

## 2019-01-07 DIAGNOSIS — K0889 Other specified disorders of teeth and supporting structures: Secondary | ICD-10-CM | POA: Diagnosis not present

## 2019-01-07 DIAGNOSIS — F988 Other specified behavioral and emotional disorders with onset usually occurring in childhood and adolescence: Secondary | ICD-10-CM | POA: Diagnosis not present

## 2019-01-07 DIAGNOSIS — I4891 Unspecified atrial fibrillation: Secondary | ICD-10-CM | POA: Diagnosis not present

## 2019-01-07 DIAGNOSIS — F39 Unspecified mood [affective] disorder: Secondary | ICD-10-CM | POA: Diagnosis not present

## 2019-01-07 DIAGNOSIS — R238 Other skin changes: Secondary | ICD-10-CM | POA: Diagnosis not present

## 2019-01-07 DIAGNOSIS — J329 Chronic sinusitis, unspecified: Secondary | ICD-10-CM | POA: Diagnosis not present

## 2019-01-13 DIAGNOSIS — I480 Paroxysmal atrial fibrillation: Secondary | ICD-10-CM | POA: Diagnosis not present

## 2019-01-15 DIAGNOSIS — R0602 Shortness of breath: Secondary | ICD-10-CM | POA: Diagnosis not present

## 2019-01-15 DIAGNOSIS — R591 Generalized enlarged lymph nodes: Secondary | ICD-10-CM | POA: Diagnosis not present

## 2019-01-26 DIAGNOSIS — Z789 Other specified health status: Secondary | ICD-10-CM | POA: Diagnosis not present

## 2019-01-26 DIAGNOSIS — L02821 Furuncle of head [any part, except face]: Secondary | ICD-10-CM | POA: Diagnosis not present

## 2019-01-26 DIAGNOSIS — L570 Actinic keratosis: Secondary | ICD-10-CM | POA: Diagnosis not present

## 2019-01-26 DIAGNOSIS — L820 Inflamed seborrheic keratosis: Secondary | ICD-10-CM | POA: Diagnosis not present

## 2019-01-26 DIAGNOSIS — L538 Other specified erythematous conditions: Secondary | ICD-10-CM | POA: Diagnosis not present

## 2019-01-26 DIAGNOSIS — L0101 Non-bullous impetigo: Secondary | ICD-10-CM | POA: Diagnosis not present

## 2019-01-26 DIAGNOSIS — L718 Other rosacea: Secondary | ICD-10-CM | POA: Diagnosis not present

## 2019-01-28 ENCOUNTER — Telehealth: Payer: Self-pay | Admitting: Cardiovascular Disease

## 2019-01-28 DIAGNOSIS — I480 Paroxysmal atrial fibrillation: Secondary | ICD-10-CM | POA: Diagnosis not present

## 2019-01-28 NOTE — Telephone Encounter (Signed)
LVM for patient to call and schedule 6 month followup. ST

## 2019-02-01 DIAGNOSIS — I480 Paroxysmal atrial fibrillation: Secondary | ICD-10-CM | POA: Diagnosis not present

## 2019-03-19 DIAGNOSIS — N9089 Other specified noninflammatory disorders of vulva and perineum: Secondary | ICD-10-CM | POA: Diagnosis not present

## 2019-06-18 ENCOUNTER — Encounter: Payer: Self-pay | Admitting: General Practice

## 2019-06-23 DIAGNOSIS — R748 Abnormal levels of other serum enzymes: Secondary | ICD-10-CM | POA: Diagnosis not present

## 2019-06-26 ENCOUNTER — Ambulatory Visit: Payer: Medicare Other

## 2019-08-18 DIAGNOSIS — F39 Unspecified mood [affective] disorder: Secondary | ICD-10-CM | POA: Diagnosis not present

## 2019-08-18 DIAGNOSIS — Z0000 Encounter for general adult medical examination without abnormal findings: Secondary | ICD-10-CM | POA: Diagnosis not present

## 2019-08-18 DIAGNOSIS — R748 Abnormal levels of other serum enzymes: Secondary | ICD-10-CM | POA: Diagnosis not present

## 2019-08-18 DIAGNOSIS — M79675 Pain in left toe(s): Secondary | ICD-10-CM | POA: Diagnosis not present

## 2019-08-24 DIAGNOSIS — S61011A Laceration without foreign body of right thumb without damage to nail, initial encounter: Secondary | ICD-10-CM | POA: Diagnosis not present

## 2019-08-24 DIAGNOSIS — Z23 Encounter for immunization: Secondary | ICD-10-CM | POA: Diagnosis not present

## 2019-08-24 DIAGNOSIS — L03113 Cellulitis of right upper limb: Secondary | ICD-10-CM | POA: Diagnosis not present

## 2019-08-24 DIAGNOSIS — M7989 Other specified soft tissue disorders: Secondary | ICD-10-CM | POA: Diagnosis not present

## 2019-08-24 DIAGNOSIS — S99921A Unspecified injury of right foot, initial encounter: Secondary | ICD-10-CM | POA: Diagnosis not present

## 2019-08-24 DIAGNOSIS — L600 Ingrowing nail: Secondary | ICD-10-CM | POA: Diagnosis not present

## 2019-08-24 DIAGNOSIS — S61401A Unspecified open wound of right hand, initial encounter: Secondary | ICD-10-CM | POA: Diagnosis not present

## 2019-08-24 DIAGNOSIS — M79641 Pain in right hand: Secondary | ICD-10-CM | POA: Diagnosis not present

## 2019-08-24 DIAGNOSIS — S6991XA Unspecified injury of right wrist, hand and finger(s), initial encounter: Secondary | ICD-10-CM | POA: Diagnosis not present

## 2019-08-25 DIAGNOSIS — L600 Ingrowing nail: Secondary | ICD-10-CM | POA: Diagnosis not present

## 2019-08-25 DIAGNOSIS — L03032 Cellulitis of left toe: Secondary | ICD-10-CM | POA: Diagnosis not present

## 2019-08-25 DIAGNOSIS — M79675 Pain in left toe(s): Secondary | ICD-10-CM | POA: Diagnosis not present

## 2019-09-07 DIAGNOSIS — L03032 Cellulitis of left toe: Secondary | ICD-10-CM | POA: Diagnosis not present

## 2019-09-07 DIAGNOSIS — M79675 Pain in left toe(s): Secondary | ICD-10-CM | POA: Diagnosis not present

## 2019-09-28 DIAGNOSIS — I4891 Unspecified atrial fibrillation: Secondary | ICD-10-CM | POA: Diagnosis not present

## 2019-09-28 DIAGNOSIS — R197 Diarrhea, unspecified: Secondary | ICD-10-CM | POA: Diagnosis not present

## 2019-09-29 DIAGNOSIS — R197 Diarrhea, unspecified: Secondary | ICD-10-CM | POA: Diagnosis not present

## 2019-10-28 DIAGNOSIS — M79642 Pain in left hand: Secondary | ICD-10-CM | POA: Diagnosis not present

## 2019-10-28 DIAGNOSIS — W57XXXA Bitten or stung by nonvenomous insect and other nonvenomous arthropods, initial encounter: Secondary | ICD-10-CM | POA: Diagnosis not present

## 2019-11-16 DIAGNOSIS — I4891 Unspecified atrial fibrillation: Secondary | ICD-10-CM | POA: Diagnosis not present

## 2019-11-24 DIAGNOSIS — D696 Thrombocytopenia, unspecified: Secondary | ICD-10-CM | POA: Diagnosis not present

## 2019-11-24 DIAGNOSIS — R897 Abnormal histological findings in specimens from other organs, systems and tissues: Secondary | ICD-10-CM | POA: Diagnosis not present

## 2019-11-24 DIAGNOSIS — D72810 Lymphocytopenia: Secondary | ICD-10-CM | POA: Diagnosis not present

## 2019-11-30 DIAGNOSIS — I471 Supraventricular tachycardia: Secondary | ICD-10-CM | POA: Diagnosis not present

## 2019-11-30 DIAGNOSIS — I493 Ventricular premature depolarization: Secondary | ICD-10-CM | POA: Diagnosis not present

## 2019-12-02 DIAGNOSIS — H1132 Conjunctival hemorrhage, left eye: Secondary | ICD-10-CM | POA: Diagnosis not present

## 2019-12-24 DIAGNOSIS — Z20822 Contact with and (suspected) exposure to covid-19: Secondary | ICD-10-CM | POA: Diagnosis not present

## 2020-01-27 DIAGNOSIS — I4891 Unspecified atrial fibrillation: Secondary | ICD-10-CM | POA: Diagnosis not present

## 2020-02-01 DIAGNOSIS — F419 Anxiety disorder, unspecified: Secondary | ICD-10-CM | POA: Diagnosis not present

## 2020-02-01 DIAGNOSIS — I4891 Unspecified atrial fibrillation: Secondary | ICD-10-CM | POA: Diagnosis not present

## 2020-02-24 DIAGNOSIS — D696 Thrombocytopenia, unspecified: Secondary | ICD-10-CM | POA: Diagnosis not present

## 2020-02-24 DIAGNOSIS — D72810 Lymphocytopenia: Secondary | ICD-10-CM | POA: Diagnosis not present
